# Patient Record
Sex: Male | Born: 1945 | Race: White | Marital: Married | Smoking: Never smoker
Health system: Northeastern US, Academic
[De-identification: ages and names within clinical notes are randomized; demographics above are authoritative.]

## PROBLEM LIST (undated history)

## (undated) ENCOUNTER — Ambulatory Visit: Payer: Self-pay | Source: Ambulatory Visit | Admitting: Primary Care

## (undated) DIAGNOSIS — K219 Gastro-esophageal reflux disease without esophagitis: Secondary | ICD-10-CM

## (undated) DIAGNOSIS — B019 Varicella without complication: Secondary | ICD-10-CM

## (undated) DIAGNOSIS — M4310 Spondylolisthesis, site unspecified: Secondary | ICD-10-CM

## (undated) DIAGNOSIS — I10 Essential (primary) hypertension: Secondary | ICD-10-CM

## (undated) DIAGNOSIS — E079 Disorder of thyroid, unspecified: Secondary | ICD-10-CM

## (undated) HISTORY — DX: Gastro-esophageal reflux disease without esophagitis: K21.9

## (undated) HISTORY — DX: Spondylolisthesis, site unspecified: M43.10

## (undated) HISTORY — DX: Varicella without complication: B01.9

## (undated) HISTORY — DX: Disorder of thyroid, unspecified: E07.9

## (undated) HISTORY — PX: APPENDECTOMY: SHX54

## (undated) HISTORY — DX: Essential (primary) hypertension: I10

---

## 2006-01-12 DIAGNOSIS — E039 Hypothyroidism, unspecified: Secondary | ICD-10-CM | POA: Insufficient documentation

## 2006-02-19 DIAGNOSIS — F411 Generalized anxiety disorder: Secondary | ICD-10-CM | POA: Insufficient documentation

## 2006-02-19 DIAGNOSIS — E0500 Thyrotoxicosis with diffuse goiter without thyrotoxic crisis or storm: Secondary | ICD-10-CM | POA: Insufficient documentation

## 2006-02-19 DIAGNOSIS — K219 Gastro-esophageal reflux disease without esophagitis: Secondary | ICD-10-CM | POA: Insufficient documentation

## 2008-04-14 DIAGNOSIS — I10 Essential (primary) hypertension: Secondary | ICD-10-CM | POA: Insufficient documentation

## 2008-04-18 ENCOUNTER — Encounter: Payer: Self-pay | Admitting: Cardiology

## 2008-04-24 DIAGNOSIS — I4949 Other premature depolarization: Secondary | ICD-10-CM | POA: Insufficient documentation

## 2009-02-13 ENCOUNTER — Ambulatory Visit
Admit: 2009-02-13 | Discharge: 2009-02-13 | Disposition: A | Discharge status: Home / Self Care | Payer: Self-pay | Source: Ambulatory Visit | Attending: Primary Care | Admitting: Primary Care

## 2009-02-16 ENCOUNTER — Ambulatory Visit: Payer: Self-pay | Admitting: Primary Care

## 2009-02-20 ENCOUNTER — Ambulatory Visit: Payer: Self-pay

## 2009-03-07 ENCOUNTER — Ambulatory Visit: Payer: Self-pay

## 2009-03-08 ENCOUNTER — Ambulatory Visit: Payer: Self-pay

## 2009-03-15 ENCOUNTER — Ambulatory Visit: Payer: Self-pay

## 2009-03-16 ENCOUNTER — Other Ambulatory Visit: Payer: Self-pay | Admitting: Primary Care

## 2009-03-16 ENCOUNTER — Ambulatory Visit
Admit: 2009-03-16 | Discharge: 2009-03-16 | Disposition: A | Discharge status: Home / Self Care | Payer: Self-pay | Source: Ambulatory Visit | Attending: Primary Care | Admitting: Primary Care

## 2009-03-16 ENCOUNTER — Ambulatory Visit: Payer: Self-pay | Admitting: Primary Care

## 2009-03-23 ENCOUNTER — Other Ambulatory Visit: Payer: Self-pay | Admitting: Gastroenterology

## 2009-03-23 ENCOUNTER — Ambulatory Visit: Payer: Self-pay | Admitting: Vascular Surgery

## 2009-03-23 ENCOUNTER — Ambulatory Visit: Payer: Self-pay

## 2009-03-29 ENCOUNTER — Ambulatory Visit: Payer: Self-pay | Admitting: Vascular Surgery

## 2009-07-06 ENCOUNTER — Ambulatory Visit
Admit: 2009-07-06 | Discharge: 2009-07-06 | Disposition: A | Discharge status: Home / Self Care | Payer: Self-pay | Source: Ambulatory Visit

## 2009-07-06 LAB — PSA (EFF.4-2010): PSA (eff. 4-2010): 2.48 ng/mL (ref 0.00–4.00)

## 2009-07-12 ENCOUNTER — Ambulatory Visit: Payer: Self-pay

## 2009-07-23 ENCOUNTER — Ambulatory Visit
Admit: 2009-07-23 | Discharge: 2009-07-23 | Disposition: A | Discharge status: Home / Self Care | Payer: Self-pay | Source: Ambulatory Visit | Attending: Primary Care | Admitting: Primary Care

## 2009-07-23 ENCOUNTER — Other Ambulatory Visit: Payer: Self-pay | Admitting: Primary Care

## 2009-07-23 ENCOUNTER — Ambulatory Visit: Payer: Self-pay | Admitting: Primary Care

## 2009-07-23 NOTE — Progress Notes (Signed)
Reason For Visit   Follow up visit.  DNupp LPN.  HPI   Past medical history and medication list is reviewed.     Hypertension-no issues with his blood pressure, specifically he denies any   issues with dizziness, headaches, or any focal neurologic deficits.  From a   cardiac perspective, he denies any chest discomfort or palpitations.     Hypothyroidism-he continues with thyroid replacement without any issues of   fatigue, heat or cold intolerance, changes in his skin, or other concerns.       Recent superficial phlebitis-Several months ago, he developed pain and   swelling in his right lower extremity, and Doppler studies revealed   superficial clot in his lesser saphenous.  He continues to have pain over   the area, although it has improved, it is still present whenever he   performs activities ice skating.  Pain is dull, there is no shortness of   breath or radiation into the groin.     Nonsmoker.  Allergies   Latex-asked/denied  No Known Drug Allergy.  Current Meds   ** Medication reconciliation completed. **.  Enalapril Maleate 10 MG Tablet;TAKE 1 TABLET EVERY DAY; Rx  Levoxyl 100 MCG Tablet;TAKE 1 TABLET EVERY DAY; Rx  Aspirin 81 MG Tablet;TAKE 1 TABLET DAILY.; RPT  Glucosamine CAPS;TAKE AS DIRECTED.; RPT  Multi Vitamin Mens Tablet;TAKE 1 TABLET DAILY.; RPT  Hydrochlorothiazide 25 MG Tablet;TAKE 1 TABLET EVERY DAY; Rx.  Active Problems   Anemia (285.9)  Esophageal Reflux (530.81)  Essential Hypertension (401.9)  Generalized Anxiety Disorder (300.02)  Graves' Disease (242.00)  Hypothyroidism (244.9)  Premature Ventricular Contractions (427.69).  ROS    CONSTITUTIONAL: Appetite good, no fevers, night sweats or weight loss  CV: No chest pain, shortness of breath or peripheral edema  RESPIRATORY: No cough, wheezing or dyspnea  GI: No nausea/vomiting, abdominal pain, or change in bowel habits  GU: No dysuria, urgency or incontinence  NEURO: No MS changes, no motor weakness, no sensory changes.  Vital Signs      Recorded by NUPP,DEBRA on 23 Jul 2009 07:49 AM  BP:110/64,   HR: 56 b/min,   Temp: 96.4 F,   Height: 66 in, Weight: 195 lb, BMI: 31.5 kg/m2.  Physical Exam   GENERAL APPEARANCE: Appears stated age, well appearing, NAD  HEENT: PERRL, EOMI, TMs normal, oropharynx clear  LUNGS: Clear to auscultation and percussion  HEART: Normal S1,S2 without murmurs, gallops, or rub  ABDOMEN: NABS, soft, non-tender, without hepato-splenomegaly  EXTREMITIES: Without clubbing, cyanosis, or edema; slightly tender over the   lateral aspect of the right lower extremity, there is no palpable cord.    There is no redness or erythema, pedal pulses are normal and symmetrical  NEUROLOGIC: Alert and oriented x3, normal sensory function of the lower   extremities.  Assessment   Hypertension - his blood pressure today is outstanding, we will continue   current medical management.  Check kidney function and electrolytes.  Check   lipids as well.     Hypothyroidism-clinically it sounds like he is doing very well.  Continue   current replacement.  Check TSH.     Superficial phlebitis-still having some pain and swelling in the right   lower extremity, I would like to repeat the Doppler study to make sure   there is no propagation of the clot, deep vascular involvement, et Karie Soda.    Otherwise he may just have postphlebitic syndrome and may have to tolerate   some degree of pain  following the superficial phlebitis.  I encouraged it   he continue aspirin 325 mg daily.     Return in 6 months have full physical.  Signature   Electronically signed by: Earl Many  MD Attend.; 07/23/2009 8:06   AM EST.

## 2009-07-26 ENCOUNTER — Ambulatory Visit
Admit: 2009-07-26 | Discharge: 2009-07-26 | Disposition: A | Discharge status: Home / Self Care | Payer: Self-pay | Source: Ambulatory Visit | Attending: Primary Care | Admitting: Primary Care

## 2009-07-26 ENCOUNTER — Ambulatory Visit: Payer: Self-pay | Admitting: Primary Care

## 2009-07-26 LAB — COMPREHENSIVE METABOLIC PANEL
ALT: 17 U/L (ref 0–50)
AST: 23 U/L (ref 0–50)
Albumin: 4.3 g/dL (ref 3.5–5.2)
Alk Phos: 58 U/L (ref 40–130)
Anion Gap: 9 (ref 7–16)
Bilirubin,Total: 0.5 mg/dL (ref 0.0–1.2)
CO2: 28 mmol/L (ref 20–28)
Calcium: 9 mg/dL (ref 8.6–10.2)
Chloride: 101 mmol/L (ref 96–108)
Creatinine: 1.33 mg/dL — ABNORMAL HIGH (ref 0.67–1.17)
GFR,Black: >59 *
GFR,Caucasian: 54 * — AB
Glucose: 95 mg/dL (ref 74–106)
Lab: 20 mg/dL (ref 6–20)
Potassium: 4.2 mmol/L (ref 3.3–5.1)
Sodium: 138 mmol/L (ref 133–145)
Total Protein: 6.5 g/dL (ref 6.3–7.7)

## 2009-07-26 LAB — LIPID PANEL
Chol/HDL Ratio: 2.7
Cholesterol: 181 mg/dL
HDL: 66 mg/dL
LDL Calculated: 106 mg/dL
Non HDL Cholesterol: 115 mg/dL
Triglycerides: 43 mg/dL

## 2009-07-26 LAB — TSH: TSH: 1.95 u[IU]/mL (ref 0.27–4.20)

## 2009-11-23 ENCOUNTER — Ambulatory Visit: Payer: Self-pay | Admitting: Primary Care

## 2009-11-23 LAB — TIBC
Iron: 84 ug/dL (ref 45–170)
TIBC: 326 ug/dL (ref 250–450)
Transferrin Saturation: 26 % (ref 20–55)
UIBC: 242 ug/dL (ref 110–370)

## 2009-11-23 LAB — SEDIMENTATION RATE, AUTOMATED: Sedimentation Rate: 10 mm/h (ref 0–20)

## 2009-11-23 LAB — CK: CK: 63 U/L (ref 46–171)

## 2009-11-23 LAB — TSH: TSH: 1.71 u[IU]/mL (ref 0.27–4.20)

## 2009-11-23 LAB — VITAMIN B12: Vitamin B12: 859 pg/mL (ref 211–946)

## 2009-11-23 NOTE — Progress Notes (Signed)
Reason For Visit   Follow up visit.  DNupp LPN.  HPI   Past medical history and medication list was reviewed.     Patient is here today with several complaints, the first is migrating   myalgias, which have been involving his upper forearms, but mainly his   lower legs.  He also describes a second complaint of burning in his feet,   and occasional numbness.  Both of these symptoms seem to be associated with   lower back pain and stiffness.  He quantifies the pain is no more than 4/10   in severity, and qualifies it as more of a nuisance than anything.    However, as he recently started having back pain and stiffness over the   past 6 months, he is concerned that he may have a herniated disc.  At times   he will have weakness especially in his left leg, and it feels as if the   buckles and gives  He has not had any weight loss or significant abdominal   pain, he denies any issues with chest pain or pressure or palpitations.    Although he has myalgias affecting the muscles, he does not have   significant swelling of the joints of the wrists or knees or hips.     He does not drink alcohol excessively.  Allergies   Latex-asked/denied  No Known Drug Allergy.  Current Meds   ** Medication reconciliation completed. **.  Aspirin 81 MG Tablet;TAKE 1 TABLET DAILY.; RPT  Levoxyl 100 MCG Tablet;TAKE 1 TABLET EVERY DAY; Rx  Hydrochlorothiazide 25 MG Tablet;TAKE 1 TABLET EVERY DAY; Rx  Enalapril Maleate 10 MG Tablet;TAKE 1 TABLET EVERY DAY; Rx  Glucosamine CAPS;TAKE AS DIRECTED.; RPT  Multi Vitamin Mens Tablet;TAKE 1 TABLET DAILY.; RPT.  Active Problems   Anemia (285.9)  Esophageal Reflux (530.81)  Essential Hypertension (401.9)  Generalized Anxiety Disorder (300.02)  Graves' Disease (242.00)  Hypothyroidism (244.9)  Premature Ventricular Contractions (427.69).  ROS   CONSTITUTIONAL: Appetite good, no fevers, night sweats or weight loss  CV: No chest pain, shortness of breath or peripheral edema  RESPIRATORY: No cough, wheezing  or dyspnea  GI: No nausea/vomiting, abdominal pain, or change in bowel habits  GU: No dysuria, urgency or incontinence  NEURO: No history of seizures or significant migraine headaches.  Vital Signs   Recorded by NUPP,DEBRA on 23 Nov 2009 08:52 AM  BP:120/78,   HR: 56 b/min,   Temp: 97.4 F,   Height: 66 in, Weight: 196 lb, BMI: 31.6 kg/m2.  Physical Exam   GENERAL APPEARANCE: Appears stated age, well appearing, NAD  neck: supple, he has normal range of motion, there is certainly no bony   tenderness or nuchal rigidity  LUNGS: Clear to auscultation normal respiratory effort  HEART: Normal S1,S2 without murmurs, gallops, or rub  ABDOMEN: NABS, soft, non-tender, without hepato-splenomegaly  EXTREMITIES: Without clubbing, cyanosis, or edema  back: he is restricted in his forward flexion, slightly restricted with   side bending to the left, straight leg raise is intact bilaterally, there   is no bony tenderness directly over the midline or spinous processes.  NEUROLOGIC: Alert and oriented x3, cranial nerves II-XII intact,   motor/sensory exam normal, DTRs symmetric, normal gait.  Assessment   Back pain with potential radiculopathy in the legs     Myalgias     Plan - at this point in time it's difficult to tease out exactly what is   going on here, and  the differential diagnosis is including but not limited   to possible spinal stenosis, herniated lumbar disc, B12 deficiency, over or   under replaced thyroid, etc.     I offered him analgesics for pain relief but he declined at this time.       I would like to increase his workup including a B12 level, repeat TSH,   CPK level and a sedimentation rate.     Furthermore, we will obtain an MRI of his lumbosacral spine now that he has   the back pain with stiffness and of course the radicular symptomatology.  Signature   Electronically signed by: Earl Many  MD Attend.; 11/23/2009 9:24   AM EST.

## 2009-12-03 ENCOUNTER — Encounter: Payer: Self-pay | Admitting: Gastroenterology

## 2009-12-04 ENCOUNTER — Ambulatory Visit: Payer: Self-pay | Admitting: Primary Care

## 2009-12-04 NOTE — Progress Notes (Signed)
Reason For Visit   Right leg swollen briused redden. J Mcintyre  lpn.  HPI   Patient has a red raised and swollen area on his right lower leg after   being hit by a hockey pock several days ago.  It is painful to the touch,   pain is no more than 3/10 in severity, his biggest concern was the swelling   and redness, and because of his previous history of superficial phlebitis   he wanted to be evaluated.  He started elevating the area and it seems to   have improved, he does not have any shortness of breath nor does he have   any issues with numbness in the foot.  Allergies   Latex-asked/denied  No Known Drug Allergy.  Current Meds   Aspirin 81 MG Tablet;TAKE 1 TABLET DAILY.; RPT  Hydrochlorothiazide 25 MG Tablet;TAKE 1 TABLET EVERY DAY; Rx  Enalapril Maleate 10 MG Tablet;TAKE 1 TABLET EVERY DAY; Rx  Glucosamine CAPS;TAKE AS DIRECTED.; RPT  Multi Vitamin Mens Tablet;TAKE 1 TABLET DAILY.; RPT  Levoxyl 100 MCG Tablet;TAKE 1 TABLET EVERY DAY; Rx.  Active Problems   Anemia (285.9)  Esophageal Reflux (530.81)  Essential Hypertension (401.9)  Generalized Anxiety Disorder (300.02)  Graves' Disease (242.00)  Hypothyroidism (244.9)  Premature Ventricular Contractions (427.69).  Vital Signs   Recorded by [facility] on 04 Dec 2009 03:52 PM  BP:110/72,   HR: 58 b/min,   Weight: 196.8 lb,   Pain Scale: 0.  Physical Exam   GENERAL APPEARANCE: anxious appearing gentleman nobody talks in full   sentences with a normal respiratory rate  EXTREMITIES: he has excellent motor strength and sensory exam of the lower   extremity, certainly no foot drop, normal pedal pulses.  Lung inner aspect   of the right lower anterior tibial area he has a slightly raised hematoma   measuring roughly 2 cm.  It is tender to the touch, there is no bony   tenderness over the tibial area.  He has slight bruising which is dependent   near the foot.  Assessment   Hematoma to the leg - at this point in time I don't think he has a   fracture, consequently I  don't see any need for imaging.  I recommended   elevation of the leg, ice every night, and gentle use of ibuprofen 400 mg   every 8 hours.  He has been recommended to followup if symptoms worsen, he   developed significant shortness of breath, or numbness.  Signature   Electronically signed by: Earl Many  MD Attend.; 12/04/2009 4:32   PM EST.

## 2009-12-12 ENCOUNTER — Ambulatory Visit
Admit: 2009-12-12 | Discharge: 2009-12-12 | Disposition: A | Discharge status: Home / Self Care | Payer: Self-pay | Source: Ambulatory Visit | Attending: Primary Care | Admitting: Primary Care

## 2009-12-12 DIAGNOSIS — M79609 Pain in unspecified limb: Secondary | ICD-10-CM

## 2010-01-07 ENCOUNTER — Ambulatory Visit: Payer: Self-pay | Admitting: Primary Care

## 2010-01-07 NOTE — H&P (Signed)
 Reason For Visit   Physical. J Mcintyre lpn.  HPI   Past medical history and medication list was reviewed.     From a cardiovascular perspective, he has been doing quite well from a para   2 his blood pressure and history of PVCs.  In fact, he has not had any   problems with premature ventricular contractions or any palpitations over   the past year or so.  He plays hockey avidly, no exertional problems, no   issues with dizziness when he stands.     From an endocrinology perspective, feels generally well on his current   program of thyroid replacement.  Specifically no issues with heat or cold   tolerability.     .  Allergies   Latex-asked/denied  No Known Drug Allergy.  Current Meds   ** Medication reconciliation completed. **.  Aspirin 81 MG Tablet;TAKE 1 TABLET DAILY.; RPT  Hydrochlorothiazide 25 MG Tablet;TAKE 1 TABLET EVERY DAY; Rx  Enalapril Maleate 10 MG Tablet;TAKE 1 TABLET EVERY DAY; Rx  Glucosamine CAPS;TAKE AS DIRECTED.; RPT  Multi Vitamin Mens Tablet;TAKE 1 TABLET DAILY.; RPT  Levoxyl 100 MCG Tablet;TAKE 1 TABLET EVERY DAY; Rx.  Active Problems   Esophageal Reflux (530.81)  Essential Hypertension (401.9)  Generalized Anxiety Disorder (300.02)  Graves' Disease (242.00)  Hypothyroidism (244.9)  Premature Ventricular Contractions (427.69).  Family Hx   Positive for hypertension.  Personal Hx   Married, nonsmoker, does not drink alcohol heavily.  ROS   CONSTITUTIONAL: Appetite good, no fevers, night sweats or weight loss  EYES: No visual changes, no eye pain  ENT: No hearing difficulties, no ear pain  CV: No chest pain, shortness of breath or peripheral edema  RESPIRATORY: No cough, wheezing or dyspnea  GI: No nausea/vomiting, abdominal pain, or change in bowel habits  GU: No dysuria, urgency or incontinence  MS: No joint pain/swelling or musculoskeletal deformities  SKIN: He recently had several small bruises on his legs after being struck   by a hockey pock  NEURO: No MS changes, no motor weakness, no  sensory changes  PSYCH: No depression or anxiety  ENDOCRINE: No polyuria/polydipsia, no heat intolerance  HEME/LYMPH: No easy bleeding/bruising or swollen nodes  ALL/IMMUN: No allergic reactions.  Immunizations   Td; 24 Oct 2003  Influenza; 22 Feb 2008  Influenza; 09 Jan 2009.  Health Mgmt Plan   Colonoscopy every 10 years; for HEALTH MAINTENANCE.  Vital Signs   Recorded by [facility] on 07 Jan 2010 02:27 PM  BP:120/90,   HR: 52 b/min,   Height: 65 in, Weight: 192 lb, BMI: 32 kg/m2,   Pain Scale: 0.  Physical Exam   GENERAL APPEARANCE: Normal habitus. Well developed, well groomed. Appears   stated age. No acute distress. Color good.  MENTAL STATUS: Appears alert and oriented. slightly anxious affect  SKIN: Skin color and turgor normal. No suspicious lesions, masses, rashes,   or ulcerations. Nails and hair appear normal.  he does have a few bruises   on his inner thigh and right ankle  HEAD: Normocephalic.  EARS: External ear w/o scars, masses, or lesions. External auditory canal   intact, clear, and w/o lesions. TMs intact with normal light reflex and   landmarks. Acuity to conversational tones good.   EYES: PERRLA, EOMI. Lids w/o defect, conjunctiva and sclera appear normal.   NOSE: Nasal mucosa and turbinates pink, septum midline, no lesions.  MOUTH: Teeth in good repair. Gums pink w/o lesions. Normal appearing   mucosa, palate, and  tongue.   OROPHARYNX: Moist w/o exudate, erythema, or swelling.   NECK: Symmetric, trachea midline. Full ROM w/o pain or tenderness. Thyroid   nontender w/o enlargement or masses. Carotid pulses normal with no bruits.   No cervical lymphadenopathy.   CHEST: Respirations unlabored with normal diaphragmatic excursion. Chest   wall symmetric with no masses. Breath sounds clear bilaterally w/o wheezes,   rubs, rales, or rhonchi.  CV: bradycardic but completely regular, no significant ectopy, no murmur  ABDOMEN: Abdomen soft with normal bowel sounds. No guarding or rebound. No    palpable masses or tenderness. Liver and spleen are w/o tenderness or   enlargement. No aortic widening. No inguinal adenopathy.   GU: Normal penis, testes, and cords bilaterally, without masses or   tenderness. No penile discharge or ulcerations. Prostate 1+ in size but   soft and smooth, completely symmetrical  RECTAL: Perineum and anus w/o lesions, masses, or hemorrhoids. Normal   sphincter tone. Heme negative.  MS: Muscle tone and strength normal for age, w/o atrophy or abnormal   movement.   EXTREMITIES: Joints w/full ROM, w/o tenderness, crepitus, or contracture.   No obvious joint deformities or effusions.   NEUROLOGICAL: Cranial nerves II-XII intact. Motor strength symmetrical with   no obvious weaknesses. Superficial sensation intact bilaterally to light   touch and pain. Observed dexterity w/o ataxia or tremor. Deep tendon   reflexes full and symmetric bilaterally. Gait coordinated and smooth.  Assessment   Routine health maintenance - we reviewed the importance of having advanced   directives and of course a healthcare proxy.  Colonoscopy is up to date, I   encouraged a high fiber intake.  From a urology standpoint, he recently was   evaluated by his urologist who felt that his prostate felt clinically   benign and did not require biopsy.  The previous asymmetry that I felt last   year seems to have resolved, I specifically don't feel any nodule.  Have   recommended that we check a PSA.  Otherwise, he received a flu shot today,   at age 18 pneumonia shot would be appropriate.     HYPERTENSION  --According to JNC 7 guidelines target BP: less than 140/90 patient   currently is at goal; discussed goal with patient  Plan to reach goal includes:  --Lifestyle Modifications; discussed DASH eating plan; discussed dietary   sodium reduction; discussed aerobic physical activity ; discussed   moderation of alcohol consumption       --Following our conversation the patient is willing to make necessary   changes YES        --Self-management tool provided  NO  --Medication Management: no changes made  --Referral for Care Management:  no  --Follow up in 6 months     His EKG was reviewed, he continues to show a rather bradycardic rhythm, but   there is no PVCs.  Furthermore, he is completely asymptomatic     .  Coun/Edu    --Health care proxy discussed   --Diet/body weight discussed  --Aerobic exercise discussed  --Alcohol use discussed  --Colon CA screening discussed.  --Testicular self exam discussed  --Prostate cancer screening discussed      --Skin CA awareness/prevention discussed  --Dental care discussed  --Cardiac risk factor modification discussed  --Motor vehicle safety discussed.  Signature   Electronically signed by: Earl Many  MD Attend.; 01/07/2010 2:58   PM EST.

## 2010-04-16 NOTE — Miscellaneous (Unsigned)
 Continuity of Care Record  Created: todo  From: MOMOT, CHRISTOPHER  From:   From: [employer], EHR v10.2.7.53  To: Gwenyth Allegra  Purpose: Patient Use;       Problems  Diagnosis: Esophageal Reflux (530.81)   Diagnosis: Essential Hypertension (401.9)   Diagnosis: Generalized Anxiety Disorder (300.02)   Diagnosis: Graves' Disease (242.00)   Diagnosis: Hypothyroidism (244.9)   Diagnosis: Obstructive Sleep Apnea (327.23)   Diagnosis: Premature Ventricular Contractions (427.69)     Alerts  Allergy - Latex-asked/denied   Allergy - No Known Drug Allergy     Medications  Aspirin 81 MG Tablet; TAKE 1 TABLET DAILY. ; RPT   Enalapril Maleate 10 MG Tablet; TAKE 1 TABLET EVERY DAY ; Rx   Hydrochlorothiazide 25 MG Tablet; TAKE 1 TABLET EVERY DAY ; Rx   Levoxyl 100 MCG Tablet; TAKE 1 TABLET EVERY DAY ; Rx   Non-medication order(s); CPAP and head gear#one ; Rx     Immunizations  Td   Influenza   Influenza   Influenza

## 2010-04-16 NOTE — Letter (Signed)
March 23, 2009    Lu Duffel, MD  [address]  [address]  [place], [place]  [address]      RE:   Garrett Rodriguez  DOB:  [birth year]  Unit#: [id number]    Dear Dr. Durwin Reges:    It was a pleasure seeing your patient, Garrett Rodriguez, in my office today.  As you know, Mr. Lapine is a 65 year old male, who presents with a chief  complaint of right calf pain with walking, as well as a blood clot in his  superficial vein in his lower extremities.  Simran reports that as of the  past 6 months, what he has noted is increasing pain, which begins in his  right calf and involves his posterior right thigh to the level of the  buttock after walking certain distances.  Patient initially had not noticed  his pain.  However, now it bothers him to the point that he must rest, and  after a fair amount of rest, this pain seems to get better for him.  The  patient denies any traumatic incidents or events that he can think around  which this pain began.  The patient denies any rest pain or tissue loss  type symptoms in his lower extremities.  The patient also denies any  postprandial abdominal pain or TIA like symptoms.    Past medical history:  Significant for hypertension.    Allergies:  None.    Current medications:  Levoxyl, enalapril, hydrochlorothiazide,  multivitamin, and glucosamine, as well as an aspirin.    Social history:  Patient denies any tobacco use and drinks approximately  one alcoholic drink per day.  He is currently employed as an Airline pilot.    Family history:  Patient denies any family history of arterial or venous  related disorders.    Review of systems:  A complete 14 point review of systems was performed and  available in the office chart for review.  However, significant for some  heart palpitations, as well as some arthritic symptoms in his joints.  The  patient also reports the above pain symptoms in his calf with activity.  Of  note, patient denies any chest pain or shortness of breath upon  exertion.  Patient also denies any neurologic or psychiatric disorders.    Physical examination:  General:  An elderly male in no acute distress.  Head:  Normocephalic, atraumatic.  Pupils equally round.  Neck:  Supple,  trachea midline.  Heart:  Regular.  Abdomen:  Soft, nontender.  Peripheral  pulse exam:  2+ bilateral radial and dorsalis pedis pulses.  Right lower  extremity:  No cyanosis, clubbing, or edema.  No open lesions or sores are  noted.  No hyperpigmentation or skin changes are noted.  Left lower  extremity:  No cyanosis, clubbing, or edema.  No hyperpigmentation or skin  changes.  No open lesions or sores are noted.    Imaging studies:  Ankle brachial index at rest performed in the vascular  lab reveals an ABI of 1.17 on the right and 1.13 on the left.  Due to the  patient's symptoms, an exercise ABI was performed, which involves ABI  measurements after exercise stimulus, which revealed no change in the ABIs,  1.18 on the right and 1.19 on the left.    Assessment:  A 65 year old male with complaints of right calf pain, which  are not associated with any evidence of arterial occlusive disease or lack  of arterial perfusion.  Plan:  I had a long discussion with Mr. Livingood regarding the fact that his  arterial perfusion is indeed intact to his bilateral lower extremities, and  this does not appear to be true claudication as a result of arterial  insufficiency.  I do think that with the symptoms involving his calf, as  well as his posterior leg radiating up to his buttock, it would be  important to proceed with further workup for neurogenic causes of this pain  including low back problems and compression of his sciatic nerve as you see  fit.  I also instructed him that this superficial thrombophlebitis does not  require any treatment with anticoagulant of sort, but can be treated  symptomatically with warm and cold compresses, as well as NSAID use for  improvement in symptoms.  He should probably notice  the decrease in the  cord inflammation over the ensuing several weeks.    Thank you very much for the opportunity to provide him care, and we will  not schedule him for a formal followup visit with [place] here in the vascular  office.  However, if you have any further concerns or questions, please do  not hesitate to call.      Sincerely,                Electronically Signed and Finalized by  Majel Homer, MD 03/26/2009 09:45  ____________________________________  Majel Homer, MD      DD:   03/23/2009  DT:   03/23/2009  1:37 P  ZO/[id number]  [id number]      cc:   Lu Duffel, MD

## 2010-04-18 ENCOUNTER — Ambulatory Visit
Admit: 2010-04-18 | Discharge: 2010-04-18 | Disposition: A | Discharge status: Home / Self Care | Payer: Self-pay | Source: Ambulatory Visit | Attending: Urology | Admitting: Urology

## 2010-04-18 LAB — PSA (EFF.4-2010): PSA (eff. 4-2010): 2.32 ng/mL (ref 0.00–4.00)

## 2010-04-24 ENCOUNTER — Other Ambulatory Visit: Payer: Self-pay | Admitting: Urology

## 2010-05-22 ENCOUNTER — Ambulatory Visit: Payer: Self-pay | Admitting: Urology

## 2010-05-23 NOTE — Letter (Signed)
 May 22, 2010    Lu Duffel, MD  [address]  [address]  [place], [place]  [address]      RE:   Garrett Rodriguez, Garrett Rodriguez  DOB:  1945-07-31  Unit#: [id number]    Dear Dr. Durwin Reges:    This is a followup on Garrett Rodriguez who has history of mild nodular prostate  with normal PSA and mild Peyronie disease.  He is doing well.  He has no  significant urinary issues.  He wakes up 0 to 1 time at night.  He has mild  hesitancy at times, but it does not bother him.  He denies any dysuria.  His PSA came back lower at 2.32.  Previously, it was 2.48.  He has some  mild Peyronie's with an upward bend that does not appear to affect his  erections or ability for penetration.    On physical exam, he is well appearing in no acute distress.  No CVA  tenderness.  No suprapubic fullness.  No peripheral edema.  He does have  some hemorrhoids.  He has some mild asymmetry with no distinct nodule  noted.    His PSA is stable, and his digital rectal exam continues to be slightly  abnormal but not worrisome enough for prostate biopsy.  We will continue to  follow it.  If there is any change, he understands he will need a biopsy.  In regards to his Peyronie disease, we discussed observing this.  I  discussed some types of p.o. treatment but told him that they did not work  very well.  At this juncture, we will follow him.  He will call me with any  questions or concerns.    ASSESSMENT:     1. 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     2. Mild Peyronie's unchanged.    PLAN:     1. PSA/DRE in 1 year.     2. Observation of Peyronie's.          Sincerely,              Electronically Signed and Finalized by  Zerita Boers, MD 05/24/2010 09:00  ____________________________________  Zerita Boers, MD      DD:   05/22/2010  DT:   05/23/2010  8:51 P  DVI:  [id number]  [id number]    cc:   Lu Duffel, MD

## 2010-06-15 DIAGNOSIS — G4733 Obstructive sleep apnea (adult) (pediatric): Secondary | ICD-10-CM | POA: Insufficient documentation

## 2010-07-22 ENCOUNTER — Encounter: Payer: Self-pay | Admitting: Primary Care

## 2010-07-22 ENCOUNTER — Ambulatory Visit: Payer: Self-pay | Admitting: Primary Care

## 2010-07-22 LAB — COMPREHENSIVE METABOLIC PANEL
ALT: 15 U/L (ref 0–50)
AST: 24 U/L (ref 0–50)
Albumin: 4.4 g/dL (ref 3.5–5.2)
Alk Phos: 72 U/L (ref 40–130)
Anion Gap: 8 (ref 7–16)
Bilirubin,Total: 0.3 mg/dL (ref 0.0–1.2)
CO2: 29 mmol/L — ABNORMAL HIGH (ref 20–28)
Calcium: 9 mg/dL (ref 8.6–10.2)
Chloride: 103 mmol/L (ref 96–108)
Creatinine: 1.18 mg/dL — ABNORMAL HIGH (ref 0.67–1.17)
GFR,Black: >59 *
GFR,Caucasian: >59 *
Glucose: 100 mg/dL (ref 74–106)
Lab: 21 mg/dL — ABNORMAL HIGH (ref 6–20)
Potassium: 4.2 mmol/L (ref 3.3–5.1)
Sodium: 140 mmol/L (ref 133–145)
Total Protein: 6.6 g/dL (ref 6.3–7.7)

## 2010-07-22 LAB — LIPID PANEL
Chol/HDL Ratio: 2.7
Cholesterol: 185 mg/dL
HDL: 69 mg/dL
LDL Calculated: 105 mg/dL
Non HDL Cholesterol: 116 mg/dL
Triglycerides: 53 mg/dL

## 2010-07-22 LAB — TSH: TSH: 1.32 u[IU]/mL (ref 0.27–4.20)

## 2010-07-22 NOTE — Progress Notes (Signed)
 Reason For Visit   Follow up for HTN, thyroid and he also has mild pain on his left elbow.  HPI   Past medical history her medication list was reviewed.     Hypothyroidism-seems to be doing well with his thyroid replacement, no   issues with excessive fatigue, no problems with skin changes, hair loss, et   Karie Soda.     Hypertension-feels fantastic with hydrochlorothiazide and his ACE   inhibitor.  Historically he has had a low resting heart rate, but he is   completely asymptomatic and has not had problems with dizziness, shortness   of breath or chest pain.  He also has a history of PVCs, but they have been   intermittent, and he has not had any problems in the past few months.     Left elbow pain-he plays hockey, has slight pain 2/10 in severity in the   left lateral elbow.  It is worse when he uses it repetitiously, but no   numbness in the hand, no loss of function.  No problems with loss of grip   strength.     Nonsmoker.  Allergies   Latex-asked/denied  No Known Drug Allergy.  Current Meds   Aspirin 81 MG Tablet;TAKE 1 TABLET DAILY.; RPT  Non-medication order(s);CPAP and head gear#one; Rx  Levoxyl 100 MCG Tablet;TAKE 1 TABLET EVERY DAY; Rx  Enalapril Maleate 10 MG Tablet;TAKE 1 TABLET EVERY DAY; Rx  Hydrochlorothiazide 25 MG Tablet;TAKE 1 TABLET EVERY DAY; Rx.  Active Problems   Esophageal Reflux (530.81)  Essential Hypertension (401.9)  Generalized Anxiety Disorder (300.02)  Graves' Disease (242.00)  Hypothyroidism (244.9)  Obstructive Sleep Apnea (327.23)  Premature Ventricular Contractions (427.69).  ROS   CONSTITUTIONAL: Appetite good, no fevers, night sweats or recurrent   infections  CV: No chest pain, shortness of breath or peripheral edema  RESPIRATORY: No cough, wheezing or dyspnea  GI: No nausea/vomiting, abdominal pain, or issues with constipation  GU: No dysuria, urgency or incontinence; recently saw his urologist, who   felt that everything was stable regarding his prostate  NEURO: No MS changes,  no motor weakness, no sensory changes.  Vital Signs   Recorded by Abishai Viegas on 22 Jul 2010 08:07 AM  Pain Scale: 2  Recorded by Harlon Flor on 22 Jul 2010 07:56 AM  BP:118/78,   HR: 60 b/min,   Height: 65.25 in, Weight: 196 lb, BMI: 32.4 kg/m2.  Physical Exam   GENERAL: Appears well. NAD. Color good.  HEAD: Without bony or soft tissue defects.  EYES: Earll icterus  EARS: Canals clear, tympanic membranes WNL. Gross acuity to spoken and   whispered word WNL.  NECK: Trachea midline. Thyroid not enlarged and without masses. No   lymphadenopathy. Full ROM without pain or tenderness.  lungs clear to auscultation with normal respiratory effort  Heart as a bradycardic rate but no murmur or significant ectopy  He is tender in the left lateral epicondyle area but no palpable mass.    Good grip strength, normal radial pulses.  Assessment   HYPERTENSION  --According to JNC 7 guidelines target BP: less than 140/90 patient   currently is at goal; discussed goal with patient  Plan to reach goal includes:  --Lifestyle Modifications    --Medication Management: no changes made  --Referral for Care Management:  no  --Follow up in 6 months     check kidney function and electrolytes along with a lipid profile.     Hypothyroidism - clinically he has been recovery from  Graves' disease,   check TSH and adjust thyroid replacement if necessary     Lateral epicondylitis-recommended Advil 400 mg as needed, counterpressure   band and resting.  Signature   Electronically signed by: Earl Many  MD Attend.; 07/22/2010 8:11   AM EST.

## 2010-10-22 ENCOUNTER — Encounter: Payer: Self-pay | Admitting: Gastroenterology

## 2010-11-30 ENCOUNTER — Other Ambulatory Visit: Payer: Self-pay | Admitting: Primary Care

## 2011-01-20 ENCOUNTER — Encounter: Payer: Self-pay | Admitting: Primary Care

## 2011-01-20 ENCOUNTER — Ambulatory Visit: Payer: Self-pay | Admitting: Primary Care

## 2011-01-20 DIAGNOSIS — Z139 Encounter for screening, unspecified: Secondary | ICD-10-CM

## 2011-01-20 DIAGNOSIS — E7889 Other lipoprotein metabolism disorders: Secondary | ICD-10-CM

## 2011-01-20 DIAGNOSIS — E039 Hypothyroidism, unspecified: Secondary | ICD-10-CM

## 2011-01-20 DIAGNOSIS — Z0000 Encounter for general adult medical examination without abnormal findings: Secondary | ICD-10-CM

## 2011-01-20 NOTE — Progress Notes (Signed)
Reason For Visit     Annual health maintenance assessment    HPI     This is a delightful 65 year old gentleman, from a cardiovascular perspective he has a history of low heart rate, but things feel fantastic from that standpoint.  He also has mild hypertension which is well controlled.  Her cardiac perspective no issues with palpitations.    He also has a history of hypothyroidism in the setting of Graves' disease.  Feels good with good energy level.  No changes in his hair or skin.  No constipation issues.      Acid reflux is well-controlled, no breakthrough symptoms.    His only concern is his sister was recently diagnosed with breast cancer, she was BRCA-1 positive for mutation.    Patient Active Problem List   Diagnoses Code   . Hypothyroidism 244.9   . Graves' Disease 242.00   . Generalized Anxiety Disorder 300.02   . Esophageal Reflux 530.81   . Essential Hypertension 401.9   . Obstructive Sleep Apnea 327.23     Filed Vitals:    01/20/11 1419   BP: 118/80   Pulse: 52   Temp: 36.9 C (98.4 F)   Height: 1.676 m (5\' 6" )   Weight: 89.994 kg (198 lb 6.4 oz)     Current Outpatient Prescriptions   Medication Sig   . LEVOXYL 100 MCG tablet TAKE 1 TABLET EVERY DAY   . aspirin 81 MG tablet TAKE 1 TABLET DAILY.   . hydrochlorothiazide (HYDRODIURIL) 25 MG tablet TAKE 1 TABLET EVERY DAY   . enalapril (VASOTEC) 10 MG tablet TAKE 1 TABLET EVERY DAY         Family Hx - as above    Personal Hx - nonsmoker no alcohol    ROS   CONSTITUTIONAL: Appetite good, no fevers, night sweats or weight loss  EYES: No visual changes, no eye pain  ENT: No hearing difficulties, no ear pain  CV: No chest pain, shortness of breath or peripheral edema  RESPIRATORY: No cough, wheezing or dyspnea  GI: No nausea/vomiting, abdominal pain, or change in bowel habits  GU: No dysuria, urgency or incontinence  MS: No joint pain/swelling or musculoskeletal deformities  SKIN: No rashes  NEURO: No MS changes, no motor weakness, no sensory changes  PSYCH:  No depression or anxiety  ENDOCRINE: No polyuria/polydipsia, no heat intolerance  HEME/LYMPH: No easy bleeding/bruising or swollen nodes  ALL/IMMUN: No allergic reactions.      Physical Exam     GENERAL APPEARANCE: Normal habitus. Well developed, well groomed. Appears stated age. No acute distress. Color good.  MENTAL STATUS: Appears alert and oriented. Affect appropriate.   SKIN: Skin color and turgor normal. No suspicious lesions, masses, rashes, or ulcerations. Nails and hair appear normal.   HEAD: Normocephalic.  EARS: External ear w/o scars, masses, or lesions. External auditory canal intact, clear, and w/o lesions. TMs intact with normal light reflex and landmarks. Acuity to conversational tones good.   EYES: PERRLA, EOMI. Lids w/o defect, conjunctiva and sclera appear normal.   Fundi w/o papilledema, hemorrhage, exudates, or arterial abnormalities.  NOSE: Nasal mucosa and turbinates pink, septum midline, no lesions.  MOUTH: Teeth in good repair. Gums pink w/o lesions. Normal appearing mucosa, palate, and tongue.   OROPHARYNX: Moist w/o exudate, erythema, or swelling.   NECK: Symmetric, trachea midline. Full ROM w/o pain or tenderness. Thyroid nontender w/o enlargement or masses. Carotid pulses normal with no bruits. No cervical lymphadenopathy.   CHEST: Respirations  unlabored with normal diaphragmatic excursion. Chest wall symmetric with no masses. Breath sounds clear bilaterally w/o wheezes, rubs, rales, or rhonchi.   BREASTS: normal male breast exam, no palpable masses  CV: Normal precordium and PMI w/o lifts, heaves, or thrills. Otherwise rate is bradycardic  ABDOMEN: Abdomen soft with normal bowel sounds. No guarding or rebound. No palpable masses or tenderness. Liver and spleen are w/o tenderness or enlargement. No aortic widening. No inguinal adenopathy.   GU: Normal penis, testes, and cords bilaterally, without masses or tenderness. No penile discharge or ulcerations. Prostate no enlargement noted,  symmetric, w/o nodularity, tenderness, or masses. No hernias.  RECTAL: Perineum and anus w/o lesions, masses, or hemorrhoids. Normal sphincter tone. Heme negative.  MS: Muscle tone and strength normal for age, w/o atrophy or abnormal movement.   EXTREMITIES: Joints w/full ROM, w/o tenderness, crepitus, or contracture. No obvious joint deformities or effusions.   NEUROLOGICAL: Cranial nerves II-XII intact. Motor strength symmetrical with no obvious weaknesses. Superficial sensation intact bilaterally to light touch and pain. Observed dexterity w/o ataxia or tremor. Deep tendon reflexes full and symmetric bilaterally. Gait coordinated and smooth.    EKG: Sinus bradycardia    Assessment     Routine health maintenance-the patient is advanced directives and healthcare proxy.  Colonoscopy up to date.  Encourage high fiber.  Prostate exam was reviewed, check PSA.  Up-to-date on vaccinations, given flu shot.  Aspirin 81 mg daily for cardiovascular protection.  Followup 6 months.      We reviewed the recent testing performed on the sister, I would encourage that he be screened because of the risk of prostate cancer, male breast cancer, even thyroid malignancy.He will contact his insurance company to see if it's a covered benefit.  If the test negative I don't see any need for routine mammography but as he was positive I would perform routine male mammograms annually.    Bradycardia-EKG reviewed, no ischemia.  Patient asymptomatic.    Hypothyroidism-check TSH.    Anxiety-clinically stable.  Continue current medical management.    Of course we will also check upper has metabolic profile and a lipid panel.  Coun/Edu    --Health care proxy discussed   --Diet/body weight discussed  --Aerobic exercise discussed  --Alcohol use discussed  --Colon CA screening discussed.  --Testicular self exam discussed  --Prostate cancer screening discussed   --Breast self exam discussed.  --Mammogram screening discussed  --Skin CA  awareness/prevention discussed  --Dental care discussed  --Cardiac risk factor modification discussed.

## 2011-02-06 ENCOUNTER — Other Ambulatory Visit: Payer: Self-pay | Admitting: Primary Care

## 2011-04-04 ENCOUNTER — Telehealth: Payer: Self-pay | Admitting: Primary Care

## 2011-05-30 ENCOUNTER — Other Ambulatory Visit: Payer: Self-pay | Admitting: Primary Care

## 2011-06-02 ENCOUNTER — Other Ambulatory Visit: Payer: Self-pay | Admitting: Urology

## 2011-06-02 ENCOUNTER — Ambulatory Visit
Admit: 2011-06-02 | Discharge: 2011-06-02 | Disposition: A | Discharge status: Home / Self Care | Payer: Self-pay | Source: Ambulatory Visit | Attending: Urology | Admitting: Urology

## 2011-06-02 DIAGNOSIS — R972 Elevated prostate specific antigen [PSA]: Secondary | ICD-10-CM

## 2011-06-02 LAB — PSA (EFF.4-2010): PSA (eff. 4-2010): 2.95 ng/mL (ref 0.00–4.00)

## 2011-06-04 ENCOUNTER — Ambulatory Visit: Payer: Self-pay | Admitting: Urology

## 2011-06-04 ENCOUNTER — Encounter: Payer: Self-pay | Admitting: Urology

## 2011-06-04 VITALS — BP 129/69 | HR 56 | Ht 66.0 in | Wt 188.0 lb

## 2011-06-04 DIAGNOSIS — N486 Induration penis plastica: Secondary | ICD-10-CM | POA: Insufficient documentation

## 2011-06-04 DIAGNOSIS — Z1389 Encounter for screening for other disorder: Secondary | ICD-10-CM

## 2011-06-04 DIAGNOSIS — N402 Nodular prostate without lower urinary tract symptoms: Secondary | ICD-10-CM | POA: Insufficient documentation

## 2011-06-04 LAB — POCT URINALYSIS DIPSTICK
Blood,UA POCT: NEGATIVE
Glucose,UA POCT: NORMAL
Ketones,UA POCT: NEGATIVE
Leuk Esterase,UA POCT: NEGATIVE
Lot #: 21819501
Nitrite,UA POCT: NEGATIVE
PH,UA POCT: 7 (ref 5–8)
Specific gravity,UA POCT: 1.01 (ref 1.002–1.03)

## 2011-06-04 NOTE — Progress Notes (Signed)
This is a followup on Garrett Rodriguez who has history of mild nodular prostate with normal PSA and mild Peyronie disease. He is doing well. He has no significant urinary issues. He wakes up 0 to 1 time at night. He has mild hesitancy, but it does not bother him. Feels like emptying. He denies any dysuria. Recent PSA remains stable He has some mild Peyronie's with an upward bend unchanged. Does not affect his erections or ability for penetration.     ROS: Denies fevers, back pain, constipation    Current Outpatient Prescriptions   Medication Sig    enalapril (VASOTEC) 10 MG tablet TAKE 1 TABLET EVERY DAY    hydrochlorothiazide (HYDRODIURIL) 25 MG tablet TAKE 1 TABLET EVERY DAY    LEVOXYL 100 MCG tablet TAKE 1 TABLET EVERY DAY    aspirin 81 MG tablet TAKE 1 TABLET DAILY.       GENERAL:  No acute distress, well developed, well nourished  NEUROLOGIC:  Oriented to person, place, time, and situation  PSYCHIATRIC:  Normal mood and affect  HEENT:  Normocephalic, atraumatic.  Oropharynx clear.  Trachea midline.   SKIN:  Normal color, turgor, texture, hydration  LYMPHATIC:  Normal cervical, supraclavicular, and inguinal examination  BACK/ORTHO:  No costovertebral angle tenderness.  No tenderness of the axial skeleton.  No obvious back deformities.  ABDOMINAL:  Abdomen soft,nontender, nondistended, and without masses;    PULSES:  groin palpable bilaterally and symmetric   EXTREMITIES:  Without cyanosis, clubbing, or edema. Calves nontender  Hemorrhoids present. He has some mild asymmetry L>R with no distinct nodule noted.     PSA (eff. 07-2008)   Date Value Range Status   06/02/2011 2.95  0.00 - 4.00 ng/mL Final      Total PSA, (performed by the Roche Elecsys E170      electrochemiluminescence ECLIA) results are not absolute for      the presence or absence of malignant disease and the values      obtained with different assay methods or kits can not be used      interchangeably.   04/18/2010 2.32  0.00 - 4.00 ng/mL Final       Total PSA, (performed by the Roche Cobas 601      electrochemiluminescence ECLIA) results are not absolute for      the presence or absence of malignant disease and the values      obtained with different assay methods or kits can not be used      interchangeably.   07/06/2009 2.48  0.00-4.00 ng/mL Final      Total PSA, (performed by the Roche Cobas 601      electrochemiluminescence ECLIA) results are not absolute for      the presence or absence of malignant disease and the values      obtained with different assay methods or kits can not be used      interchangeably.     No new changes in last year. His PSA is stable, and his digital rectal exam continues to be slightly abnormal But no indication for prostate biopsy. We will continue to follow it. If there is any change, he understands he will need a biopsy. In regards to his Peyronie disease, we discussed observing this. I discussed some types of p.o. treatment but told him that they did not work very well. Patient does not want more aggressive management. At this juncture, we will follow him. Slight hesitancy discussed the benefit of alpha-blocker.  Does not  want this at this juncture. He will call me with any questions or concerns.     ASSESSMENT:   1. 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   2. Mild Peyronie's unchanged.   3. BPH- hesitancy    PLAN:   1. PSA/DRE/PVR in 1 year.   2. Observation of Peyronie's.  3. ? Alpha blocker

## 2011-06-10 ENCOUNTER — Telehealth: Payer: Self-pay | Admitting: Primary Care

## 2011-06-10 DIAGNOSIS — Z139 Encounter for screening, unspecified: Secondary | ICD-10-CM

## 2011-06-10 DIAGNOSIS — E039 Hypothyroidism, unspecified: Secondary | ICD-10-CM

## 2011-06-10 DIAGNOSIS — E7889 Other lipoprotein metabolism disorders: Secondary | ICD-10-CM

## 2011-06-10 NOTE — Telephone Encounter (Signed)
Orders done

## 2011-06-10 NOTE — Telephone Encounter (Signed)
Patient went to the lab but was told his October 2012 las had expired.  He is asking if you would please put through comprehensive,lipids,psa,tsh back in and he will get them done tomorrow and call after visit to follow up with you.

## 2011-06-10 NOTE — Telephone Encounter (Signed)
PT AWARE  

## 2011-06-11 ENCOUNTER — Ambulatory Visit
Admit: 2011-06-11 | Discharge: 2011-06-11 | Disposition: A | Discharge status: Home / Self Care | Payer: Self-pay | Source: Ambulatory Visit | Attending: Primary Care | Admitting: Primary Care

## 2011-06-11 DIAGNOSIS — Z139 Encounter for screening, unspecified: Secondary | ICD-10-CM

## 2011-06-11 DIAGNOSIS — E039 Hypothyroidism, unspecified: Secondary | ICD-10-CM

## 2011-06-11 DIAGNOSIS — E7889 Other lipoprotein metabolism disorders: Secondary | ICD-10-CM

## 2011-06-11 LAB — COMPREHENSIVE METABOLIC PANEL
ALT: 17 U/L (ref 0–50)
AST: 24 U/L (ref 0–50)
Albumin: 4.6 g/dL (ref 3.5–5.2)
Alk Phos: 71 U/L (ref 40–130)
Anion Gap: 10 (ref 7–16)
Bilirubin,Total: 0.5 mg/dL (ref 0.0–1.2)
CO2: 29 mmol/L — ABNORMAL HIGH (ref 20–28)
Calcium: 9.4 mg/dL (ref 8.6–10.2)
Chloride: 101 mmol/L (ref 96–108)
Creatinine: 1.24 mg/dL — ABNORMAL HIGH (ref 0.67–1.17)
GFR,Black: 71 *
GFR,Caucasian: 61 *
Glucose: 97 mg/dL (ref 60–99)
Lab: 13 mg/dL (ref 6–20)
Potassium: 4.4 mmol/L (ref 3.3–5.1)
Sodium: 140 mmol/L (ref 133–145)
Total Protein: 6.9 g/dL (ref 6.3–7.7)

## 2011-06-11 LAB — LIPID PANEL
Chol/HDL Ratio: 2.9
Cholesterol: 211 mg/dL — AB
HDL: 73 mg/dL
LDL Calculated: 124 mg/dL
Non HDL Cholesterol: 138 mg/dL
Triglycerides: 70 mg/dL

## 2011-06-11 LAB — PSA (EFF.4-2010): PSA (eff. 4-2010): 3.02 ng/mL (ref 0.00–4.00)

## 2011-06-11 LAB — TSH: TSH: 1.41 u[IU]/mL (ref 0.27–4.20)

## 2011-06-13 ENCOUNTER — Ambulatory Visit: Payer: Self-pay | Admitting: Primary Care

## 2011-06-13 ENCOUNTER — Encounter: Payer: Self-pay | Admitting: Primary Care

## 2011-06-13 VITALS — BP 120/78 | HR 56 | Temp 98.2°F | Resp 14 | Ht 66.0 in | Wt 197.2 lb

## 2011-06-13 DIAGNOSIS — E039 Hypothyroidism, unspecified: Secondary | ICD-10-CM

## 2011-06-13 DIAGNOSIS — I10 Essential (primary) hypertension: Secondary | ICD-10-CM

## 2011-06-13 DIAGNOSIS — H9319 Tinnitus, unspecified ear: Secondary | ICD-10-CM

## 2011-06-13 NOTE — Progress Notes (Signed)
Reason For Visit     Garrett Rodriguez in ears, thyroid, hypertension    HPI-   Past medical history medication list was reviewed and updated where appropriate     Patient has had ringing in his ears for the past 4 weeks.  No pain, no vertigo, no issues with headaches.  Neurologically, he has not had any focal deficits whatsoever.  He denies any issues with nausea or vomiting.    Patient has had underactive thyroid for many years, he feels stable with his current medication program.  No changes in the skin, hair, no issues with constipation.    Feels good regarding blood pressure management, specifically no issues with dizziness, no lightheadedness with standing.     Nonsmoker.    Current Outpatient Prescriptions   Medication Sig   . enalapril (VASOTEC) 10 MG tablet TAKE 1 TABLET EVERY DAY   . hydrochlorothiazide (HYDRODIURIL) 25 MG tablet TAKE 1 TABLET EVERY DAY   . LEVOXYL 100 MCG tablet TAKE 1 TABLET EVERY DAY   . aspirin 81 MG tablet TAKE 1 TABLET DAILY.     Filed Vitals:    06/13/11 1630   BP: 120/78   Pulse: 56   Temp: 36.8 C (98.2 F)   Resp: 14   Height: 1.676 m (5\' 6" )   Weight: 89.449 kg (197 lb 3.2 oz)     Patient Active Problem List   Diagnoses Code   . Hypothyroidism 244.9   . Graves' Disease 242.00   . Generalized Anxiety Disorder 300.02   . Esophageal Reflux 530.81   . Essential Hypertension 401.9   . Obstructive Sleep Apnea 327.23   . Nodular prostate 600.10   . Peyronie disease 607.85           ROS-  CONSTITUTIONAL: Appetite good, no fevers, night sweats or weight loss  CV: No chest pain, shortness of breath or peripheral edema  RESPIRATORY: No cough, wheezing or dyspnea  GI: No nausea/vomiting, abdominal pain, or change in bowel habits  GU: No dysuria, urgency or incontinence  NEURO: No MS changes, no motor weakness, no sensory changes.        Physical Exam-    GENERAL APPEARANCE: Appears stated age, well appearing, NAD  HEENT: PERRL, EOMI, TMs normal, oropharynx clear  NECK: good carotid upstrokes, without  bruit, no jugular venous distention, nontender thyroid  LUNGS: Clear to auscultation and percussion; he has normal respiratory effort  HEART: heart is regular in rate, no murmur or rub  ABDOMEN: NABS, soft, non-tender, without hepato-splenomegaly  EXTREMITIES: Without clubbing, cyanosis, or edema  NEUROLOGIC: Alert and oriented x3, no tremor.    Assessment-     Ringing in ears-he does not have any worrisome signs or symptoms such as hearing loss, vertigo, headaches or focal neurologic deficits.  This is probably idiopathic.  At this point in time we decided on observation unless he develops any of those worrisome symptoms.  In the meantime I recommended he avoid loud noises such as shooting firearms, et cetera.    Hypertension-blood pressure is excellent less than his stated goal of 140/90.  Continue low salt intake, aerobic exercise.  Kidney function is appropriate.    Hypothyroidism-he is stable, continue current therapy.  Followup 6 months

## 2011-07-21 ENCOUNTER — Ambulatory Visit: Payer: Self-pay | Admitting: Primary Care

## 2011-09-02 ENCOUNTER — Telehealth: Payer: Self-pay | Admitting: Primary Care

## 2011-09-02 MED ORDER — LEVOTHYROXINE SODIUM 100 MCG PO CAPS
ORAL_CAPSULE | ORAL | Status: DC
Start: 2011-09-02 — End: 2011-10-01

## 2011-09-02 NOTE — Telephone Encounter (Signed)
Pharmacy called and said his thyroid medication has been recalled and he needs a substitute pt is out of medication and is leaving   Wed. @ 6;30 am for [place].

## 2011-09-02 NOTE — Telephone Encounter (Signed)
I changed him to Levothyroxine 100 micrograms daily    Note to secretarial staff: please indicate actual NAME, STRENGTH and DOSAGE when documenting these messages instead of vague terms such as "thyroid medication" or "BP medication", etc.

## 2011-10-01 ENCOUNTER — Other Ambulatory Visit: Payer: Self-pay | Admitting: Primary Care

## 2011-10-01 MED ORDER — LEVOTHYROXINE SODIUM 100 MCG PO CAPS
ORAL_CAPSULE | ORAL | Status: DC
Start: 2011-10-01 — End: 2012-02-23

## 2011-10-03 ENCOUNTER — Telehealth: Payer: Self-pay | Admitting: Primary Care

## 2011-10-03 DIAGNOSIS — Z139 Encounter for screening, unspecified: Secondary | ICD-10-CM

## 2011-10-03 NOTE — Telephone Encounter (Signed)
We would have to refer him for formal genetic counseling to get this done (they may have a better chance for insurance purposes)    A screening mammogram is certainly worthwhile and I will put in the referral.

## 2011-10-03 NOTE — Telephone Encounter (Signed)
Patient has a strong family history of Breast CA.  He is trying to have genetic testing done to see if he carries the BRCA1-2 marker.  His insurance [employer] stated he needs information from his PCP office to have this done and get a quote on cost and coverage.  The patient states 2 of his sisters were diagnosed with breast cancer and have recentl had bilateral mastectomies. Trula Ore suggested he start with a mammogram on himself due to his strong family history.  Please contact the patient to advise him, he has a letter from his sister in [place] in his scanned media for reference.  Thank you=LLH

## 2011-10-06 NOTE — Telephone Encounter (Signed)
I left a message to have patient to call the office back to give Dr Sharene Skeans message below

## 2011-10-31 ENCOUNTER — Encounter: Payer: Self-pay | Admitting: Gastroenterology

## 2011-11-07 ENCOUNTER — Telehealth: Payer: Self-pay | Admitting: Primary Care

## 2011-11-07 DIAGNOSIS — Z139 Encounter for screening, unspecified: Secondary | ICD-10-CM

## 2011-11-07 DIAGNOSIS — D229 Melanocytic nevi, unspecified: Secondary | ICD-10-CM

## 2011-11-07 NOTE — Telephone Encounter (Signed)
Spoke with patient and let him know we would be sending off the referrals and they would be contacting him

## 2011-11-07 NOTE — Telephone Encounter (Signed)
Call pt:    Referral made to derm and genetics for him

## 2011-11-07 NOTE — Telephone Encounter (Signed)
Pt calls regarding two referrals.   1. States that he has discussed with you a referral to genetic testing for the brca gene. Did you want to refer him for testing?  2. Pt states he has two dark spots on his forehead that are getting larger and he is interested in a referral to dermatology. Secretaries are to find out if pt needs an insurance referral for derm.

## 2012-01-02 ENCOUNTER — Ambulatory Visit
Admit: 2012-01-02 | Discharge: 2012-01-02 | Disposition: A | Discharge status: Home / Self Care | Payer: Self-pay | Source: Ambulatory Visit | Attending: Pediatric Genetics | Admitting: Pediatric Genetics

## 2012-01-02 ENCOUNTER — Ambulatory Visit: Payer: Self-pay | Admitting: Pediatric Genetics

## 2012-01-02 ENCOUNTER — Encounter: Payer: Self-pay | Admitting: Gastroenterology

## 2012-01-02 ENCOUNTER — Encounter: Payer: Self-pay | Admitting: Pediatric Genetics

## 2012-01-02 DIAGNOSIS — Z809 Family history of malignant neoplasm, unspecified: Secondary | ICD-10-CM | POA: Insufficient documentation

## 2012-01-02 NOTE — Patient Instructions (Signed)
Testing for familial BRCA mutation

## 2012-01-02 NOTE — Progress Notes (Signed)
Reason for Visit : It was a pleasure to see your patient Garrett Rodriguez in the Division of Genetics at the [facility] on January 02, 2012.  The patient was referred to discuss a family history of cancer and familial BRCA 1 mutation.    Chief Complaint : Family history of cancer; relative with BRCA 1 mutation    HPI :  He is interested in genetic counseling and possibly genetic testing.    Family history :   A three generation pedigree was obtained at the visit.  Pertinent family history includes:    Sister - breast cancer diagnosed age 68; BRCA 1 mutation ([id number])  Paternal grandmother - breast cancer; died age 40    Maternal uncle - ?liver or pancreatic cancer; deceased    The family is of [place], [place] and [place] ancestry.    There is no additional known history of cancer on either side of the patient's family.  Pathology results were not available for our review at the time of the counseling session.    Personal history :   [name] is cancer free at the age of 66    Assessment :  I spent 45 minutes with this patient, >50% of which was in counseling and coordination of care.  Discussed with patient -  Personal history  Family history  Basic overview of cancer  Sporadic vs. Familial/Inherited cancer  Autosomal Dominant Inheritance  Risks associated with specific mutations  Outcome of testing  Estimated risk for positive result  Implication of result for other family members  Management options    Tests :   Known familial mutation testing for the previously identified BRCA 1 mutation only    Counseling/Education:  We discussed the risks, benefits and limitations of testing.  We also discussed the issues of confidentiality.  Records and results will be forwarded to those physicians designated by the patient if testing is pursued.    Single Site  We reviewed the possible outcome of testing.  If your patient pursues testing and is found to have a mutation in the BRCA1 gene,  he will be at increased risk for the associated cancers.  If he is tested and found to be negative for the mutation previously identified in the family, he will likely face the same risks for cancer as a male at age 30 in the general population.    We reviewed autosomal dominant inheritance and the 1 in 2 (50%) chance of carrying the mutation previously identified in the patient's sister.      Plan :   Garrett Rodriguez elected to proceed with testing today and blood was drawn directly following the counseling session.  An in-person results disclosure session will be scheduled.    It was a pleasure meeting with your patient.  Please do not hesitate to contact our office at [phone] if we may be of additional assistance.      Felicity Pellegrini, M.D.  Division of [employer]

## 2012-01-13 ENCOUNTER — Ambulatory Visit: Payer: Self-pay | Admitting: Dermatology

## 2012-01-13 ENCOUNTER — Encounter: Payer: Self-pay | Admitting: Dermatology

## 2012-01-13 VITALS — BP 119/70 | Ht 66.0 in | Wt 198.0 lb

## 2012-01-13 DIAGNOSIS — D229 Melanocytic nevi, unspecified: Secondary | ICD-10-CM

## 2012-01-13 DIAGNOSIS — L814 Other melanin hyperpigmentation: Secondary | ICD-10-CM

## 2012-01-13 DIAGNOSIS — L821 Other seborrheic keratosis: Secondary | ICD-10-CM

## 2012-01-13 NOTE — Progress Notes (Addendum)
Consulting MD: Earl Many, MD    CC: two brown spots on forehead; FSE    HPI: Pt is a 66 y.o. Caucasian male who presents for the first time to dermatology clinic with the above complaints. He has noticed two brown spots on his forehead that have been enlarging over the past few months. They do not bleed or itch. They are not painful. He denies a personal history of skin cancer, but he does report a history of having a few moles removed from his back as a child. He is unsure about why they were removed, but he did not need any subsequent treatment. He has a sister and grandmother with breast cancer- his sister is BRCA 1 positive. He is being tested for this, but does not yet have the results.     Patient reports wearing sunscreen routinely. He also wears a hat when in the sun. He had shingles 2-3 years ago across his left upper back, which has led to occasional pruritus in the affected area. He has no residual pain. Denies unintended weight loss or night sweats.    ROS: Pt is otherwise in normal state of health    Allergies:  Allergies   Allergen Reactions   . No Known Drug Allergy      Created by Conversion - 0;    . No Known Latex Allergy      Created by Conversion - 0;        PMH:   Patient Active Problem List   Diagnosis Code   . Hypothyroidism 244.9   . Graves' Disease 242.00   . Generalized Anxiety Disorder 300.02   . Esophageal Reflux 530.81   . Essential Hypertension 401.9   . Obstructive Sleep Apnea 327.23   . Nodular prostate 600.10   . Peyronie disease 607.85   . Family history of cancer V16.9     Current Outpatient Prescriptions on File Prior to Visit   Medication Sig Dispense Refill   . Multiple Vitamin (MULTIVITAMIN) TABS Take by mouth       . Glucosamine-Chondroitin (GLUCOSAMINE CHONDR COMPLEX PO) Take by mouth       . Levothyroxine Sodium 100 MCG CAPS Take one daily  30 capsule  5   . enalapril (VASOTEC) 10 MG tablet TAKE 1 TABLET EVERY DAY  90 tablet  4   . hydrochlorothiazide (HYDRODIURIL)  25 MG tablet TAKE 1 TABLET EVERY DAY  90 tablet  3   . aspirin 81 MG tablet TAKE 1 TABLET DAILY.    0     No current facility-administered medications on file prior to visit.       FH:  Family History   Problem Relation Age of Onset   . Cancer Paternal Grandmother      Breast   . Cancer Sister      Breast       SocH:  History     Social History   . Marital Status: Married     Spouse Name: N/A     Number of Children: N/A   . Years of Education: N/A     Occupational History   . Not on file.     Social History Main Topics   . Smoking status: Never Smoker    . Smokeless tobacco: Never Used   . Alcohol Use: Not on file   . Drug Use: Not on file   . Sexually Active: Not on file     Other Topics Concern   .  Not on file     Social History Narrative   . No narrative on file         PE  Filed Vitals:    01/13/12 1044   BP: 119/70   Height: 1.676 m (5\' 6" )   Weight: 89.812 kg (198 lb)     General: Awake and alert, adult male in NAD, well nourished  Examination of the following was WNL unless otherwise noted-  -Face/Neck/Scalp:   - On the central forehead, there is a brown, stuck-on papule with psuedo horn cysts upon dermoscopy   - Over the left forehead, there is a tan, evenly pigmented macule  -Chest/Abdomen/Back:   - Over the left upper back, there is an atrophic white plaque with some central pink-purple areas within in c/w scar  -BUE/hands:  -BLE/feet:  -Buttocks/Groin:  -Nails/Hair:   * Throughout there are numerous tan to brown, evenly pigmented macules   * There are no signs of cutaneous malignancy upon exam today    Barriers to learning: None    Assessment/Plan:    1) Nevi, numerous benign  -Diagnosis discussed  -Patient reassured  -No treatment needed at this time  -Counseled re: wearing sunscreen and protective clothing; monitoring moles based on ABCDE criteria  -Advised patient that should he find out he is BRCA2+, he should definitely have an eye exam; if BRCA1+, he should also have an eye exam, though the possible  association between this gene and melanoma is not well established  -Advised patient to monitor his own moles and to have PCP monitor them annually; he can follow up with derm for FSE prn    2) Seborrheic keratosis, forehead (and throughout)  -Diagnosis discussed  -Patient reassured  -No treatment needed at this time    3) Solar lentigo (forehead and on b/l arms)  -Diagnosis discussed  -Patient reassured  -No treatment needed at this time    4) Scar, left upper back (patient does not remember where it came from, possibly from biopsy as a child)  - advised patient to monitor lesion; if changes, return to clinic for evaluation    RTC PRN    Cresenciano Genre, MD    I saw and evaluated the patient.  I agree with the resident's findings and plan of care as documented above.  I was present for key and critical portions of the procedure and immediately available.

## 2012-01-13 NOTE — Patient Instructions (Addendum)
1) Skin exam  - lesions on your forehead are benign; one is called a seborrheic keratosis, the other is a solar lentigo (liver spot)  - If BRCA 2 positive, we strongly recommend an eye exam given possible association with uveal melanoma; if BRCA 1 positive, we also recommend an eye exam (to look for any melanoma in the eye), though this is less strong of a connection than BRCA 2  - no signs of cutaneous malignancy upon exam today  - Keep an eye on the scar on your back; if it changes considerably, please come back to see [place] (have your wife look at it and take a picture for monitoring)  - We recommend monitoring moles (monitor then your self monthly and have your PCP keep an eye on then annually); can return to [place] PRN, but we do recommend periodic monitoring of moles  - Continue to wear sunscreen SPF 30 or greater, protective clothing when in sun    Monitor moles for the following changes:     A: asymmetry  B: border changes  C: color changes   D: diameter greater than a pencil eraser size   E: evolving, or changing mole       Follow up as needed

## 2012-01-22 ENCOUNTER — Telehealth: Payer: Self-pay

## 2012-01-22 ENCOUNTER — Telehealth: Payer: Self-pay | Admitting: Primary Care

## 2012-01-22 NOTE — Telephone Encounter (Signed)
A message was left requesting that the patient return my call. The patient's BRCA1 testing was negative.

## 2012-01-22 NOTE — Telephone Encounter (Signed)
Spoke with patient to review results of BRCA testing.  Site-specific BRCA 1 testing for the known familial mutation was completed and negative (no mutation detected).  All questions were answered and results follow-up offered.  He will call if he wishes to schedule an appointment.  Results will be faxed to providers and mailed to patient.

## 2012-01-22 NOTE — Telephone Encounter (Signed)
The patient has been informed.

## 2012-02-11 ENCOUNTER — Other Ambulatory Visit: Payer: Self-pay | Admitting: Primary Care

## 2012-02-23 ENCOUNTER — Other Ambulatory Visit: Payer: Self-pay | Admitting: Primary Care

## 2012-02-23 MED ORDER — LEVOTHYROXINE SODIUM 100 MCG PO CAPS
ORAL_CAPSULE | ORAL | Status: DC
Start: 2012-02-23 — End: 2012-03-29

## 2012-03-26 ENCOUNTER — Ambulatory Visit: Payer: Self-pay | Admitting: Primary Care

## 2012-03-26 ENCOUNTER — Encounter: Payer: Self-pay | Admitting: Primary Care

## 2012-03-26 VITALS — BP 112/64 | HR 56 | Temp 98.0°F | Ht 66.0 in | Wt 190.6 lb

## 2012-03-26 DIAGNOSIS — E039 Hypothyroidism, unspecified: Secondary | ICD-10-CM

## 2012-03-26 DIAGNOSIS — M79605 Pain in left leg: Secondary | ICD-10-CM

## 2012-03-26 DIAGNOSIS — Z0000 Encounter for general adult medical examination without abnormal findings: Secondary | ICD-10-CM

## 2012-03-26 MED ORDER — PREDNISONE 10 MG PO TABS *I*
ORAL_TABLET | ORAL | Status: DC
Start: 2012-03-26 — End: 2012-07-16

## 2012-03-26 NOTE — Progress Notes (Signed)
Reason For Visit     Annual health maintenance review    Left leg pain    HPI     Patient is here today for annual health maintenance assessment.  And a separately identifiable problem, he is also having burning sensation in the left foot and leg.    His history of Graves' disease which was treated, he has been a left hypothyroid ever since.  He feels well, has good energy level, denies any changes in skin or hair.    He has had mild anxiety but no depression.  He does very well from this perspective, has had good sleep hygiene, exercises every day and feels well from that standpoint.    His acid reflux has been asymptomatic, he has not had any problems with breakthrough abdominal pain, blood in the stool, unexplained diarrhea, or any issues with nausea or vomiting.    From a cardiac standpoint, his blood pressure has been outstanding, he has not had dizziness.  He plays hockey several days a week, very heavy aggressively, has not had heaviness in the chest or shortness of breath.    His only issue today, and a separately identifiable problem, as burning pain in the left buttock which radiates into his left foot and ankle.  He describes it as a burning sensation, and it's worse when he sitting for long intervals on his left leg.  When he changes position such as in bed, in improved.  He has tried various anti-inflammatories over-the-counter including Advil but no improvement.  He does not have lower back pain, fever or chills, and there was no trauma.  He has not had any problems with groin pain or penile discharge.    Patient Active Problem List   Diagnosis Code   . Hypothyroidism 244.9   . Graves' Disease 242.00   . Generalized Anxiety Disorder 300.02   . Esophageal Reflux 530.81   . Essential Hypertension 401.9   . Obstructive Sleep Apnea 327.23   . Nodular prostate 600.10   . Peyronie disease 607.85   . Family history of cancer V16.9     Current Outpatient Prescriptions   Medication Sig   . predniSONE  (DELTASONE) 10 MG tablet 4 tabs PO daily/2 days; 3 tabs daily/2 days; 2 tabs daily/2 days; one tabs daily/2 days; 1/2 tab daily/2 days   . Levothyroxine Sodium 100 MCG CAPS Take one daily   . hydrochlorothiazide (HYDRODIURIL) 25 MG tablet TAKE 1 TABLET EVERY DAY   . Multiple Vitamin (MULTIVITAMIN) TABS Take by mouth   . Glucosamine-Chondroitin (GLUCOSAMINE CHONDR COMPLEX PO) Take by mouth   . enalapril (VASOTEC) 10 MG tablet TAKE 1 TABLET EVERY DAY   . aspirin 81 MG tablet TAKE 1 TABLET DAILY.     No current facility-administered medications for this visit.     Filed Vitals:    03/26/12 1431   BP: 112/64   Pulse: 56   Temp: 36.7 C (98 F)   Height: 1.676 m (5\' 6" )   Weight: 86.456 kg (190 lb 9.6 oz)       Family Hx - sister was recently diagnosed with breast cancer    Personal Hx - does not smoke, drinks a few beers per week, nothing excessive, happily married    ROS   CONSTITUTIONAL: Appetite good, no fevers, night sweats or weight loss  EYES: No visual changes, no eye pain  ENT: No hearing difficulties, no ear pain  CV: No chest pain, shortness of breath or peripheral edema  RESPIRATORY: No cough, wheezing or dyspnea  GI: No nausea/vomiting, abdominal pain, or change in bowel habits  GU: No dysuria, urgency or incontinence  SKIN: No rashes  NEURO: No MS changes, no motor weakness, no sensory changes  PSYCH: No depression or anxiety  ENDOCRINE: No polyuria/polydipsia, no heat intolerance  HEME/LYMPH: No easy bleeding/bruising or swollen nodes  ALL/IMMUN: No allergic reactions.      Physical Exam     GENERAL APPEARANCE: Normal habitus. Well developed, well groomed. Appears stated age. No acute distress. Color good.  MENTAL STATUS: Appears alert and oriented. Affect appropriate.   SKIN: Skin color and turgor normal. No suspicious lesions, masses, rashes, or ulcerations. Nails and hair appear normal.   HEAD: Normocephalic.  EARS: External ear w/o scars, masses, or lesions. External auditory canal intact, clear, and  w/o lesions. TMs intact with normal light reflex and landmarks. Acuity to conversational tones good.   EYES: PERRLA, EOMI. Lids w/o defect  NOSE: Nasal mucosa and turbinates pink, septum midline, no lesions.  MOUTH: Teeth in good repair. Gums pink w/o lesions. Normal appearing mucosa, palate, and tongue.   OROPHARYNX: Moist w/o exudate, erythema, or swelling.   NECK: Symmetric, trachea midline. Full ROM w/o pain or tenderness. Thyroid nontender w/o enlargement or masses. Carotid pulses normal with no bruits. No cervical lymphadenopathy.   CHEST: Respirations unlabored with normal diaphragmatic excursion. Chest wall symmetric with no masses. Breath sounds clear bilaterally w/o wheezes, rubs, rales, or rhonchi.   CV: Normal precordium and PMI w/o lifts, heaves, or thrills.  bradycardic in rate but regular, no murmur   ABDOMEN: Abdomen soft with normal bowel sounds. No guarding or rebound. No palpable masses or tenderness. Liver and spleen are w/o tenderness or enlargement. No aortic widening. No inguinal adenopathy.   GU: Normal penis, testes, and cords bilaterally, without masses or tenderness. No penile discharge or ulcerations. Prostate 1+ was soft and smooth   MS: excellent pedal pulses, he has a positive straight leg raise on the left side, no bony tenderness in the back with normal range of motion, no palpable mass in the hip or groin    NEUROLOGICAL: Cranial nerves II-XII intact. Motor strength symmetrical with no obvious weaknesses. Superficial sensation intact bilaterally to light touch and pain. Observed dexterity w/o ataxia or tremor. Deep tendon reflexes full and symmetric bilaterally. Gait coordinated and smooth.    EKG: Sinus bradycardia     Assessment     Routine health maintenance-we reviewed the importance of having advanced directives and a health care proxy.  He has excellent sleep hygiene, he is negative for depression.  Lower his for falling, he does very well with aerobic activity at least 100  minutes per week.  Advised on low salt intake and of course a well-rounded diet.  His prostate exam shows enlargement but it feels symmetrical there is no ulcerations or nodules on clinical exam.  We will check a PSA, understanding there is some limitations with the testing.  He received a flu shot today.    Mild acid reflux-continue current medical management.  Up-to-date with colorectal cancer screening.    Hypertension-nothing suggestive of ischemia on his EKG, and his bradycardia is asymptomatic.  He is at target less than 140/90.  Continue current medical management, of course diarrhea aerated the low salt intake was important.  We will check sugar and kidney function.    Left leg pain-suggestive of sciatica and I think he has impingement in the left buttock area.  He does  not have true weakness in the leg, fevers, loss of function, slight think a more ominous process is highly unlikely such as malignancy, infection, or severe herniated disc.  I like to put him on prednisone 40/30/20/10/5 mg with each dose for 2 days duration to hopefully improve nerve root impingement.  I acid gave him instruction on stretching.  Followup if no better.    Coun/Edu    --Health care proxy discussed   --Diet/body weight discussed  --Aerobic exercise discussed  --Alcohol use discussed  --Colon CA screening discussed.  --Testicular self exam discussed  --Prostate cancer screening discussed   --Mammogram screening discussed  --Skin CA awareness/prevention discussed  --Dental care discussed  --Cardiac risk factor modification discussed.

## 2012-03-27 ENCOUNTER — Ambulatory Visit
Admit: 2012-03-27 | Discharge: 2012-03-27 | Disposition: A | Discharge status: Home / Self Care | Payer: Self-pay | Source: Ambulatory Visit | Attending: Primary Care | Admitting: Primary Care

## 2012-03-27 DIAGNOSIS — E039 Hypothyroidism, unspecified: Secondary | ICD-10-CM

## 2012-03-27 DIAGNOSIS — Z0000 Encounter for general adult medical examination without abnormal findings: Secondary | ICD-10-CM

## 2012-03-27 LAB — LIPID PANEL
Chol/HDL Ratio: 2.4
Cholesterol: 201 mg/dL — AB
HDL: 84 mg/dL
LDL Calculated: 104 mg/dL
Non HDL Cholesterol: 117 mg/dL
Triglycerides: 65 mg/dL

## 2012-03-27 LAB — COMPREHENSIVE METABOLIC PANEL
ALT: 24 U/L (ref 0–50)
AST: 24 U/L (ref 0–50)
Albumin: 4.5 g/dL (ref 3.5–5.2)
Alk Phos: 69 U/L (ref 40–130)
Anion Gap: 11 (ref 7–16)
Bilirubin,Total: 0.4 mg/dL (ref 0.0–1.2)
CO2: 29 mmol/L — ABNORMAL HIGH (ref 20–28)
Calcium: 9.3 mg/dL (ref 8.6–10.2)
Chloride: 100 mmol/L (ref 96–108)
Creatinine: 1.22 mg/dL — ABNORMAL HIGH (ref 0.67–1.17)
GFR,Black: 72 *
GFR,Caucasian: 62 *
Glucose: 104 mg/dL — ABNORMAL HIGH (ref 60–99)
Lab: 22 mg/dL — ABNORMAL HIGH (ref 6–20)
Potassium: 5 mmol/L (ref 3.3–5.1)
Sodium: 140 mmol/L (ref 133–145)
Total Protein: 6.6 g/dL (ref 6.3–7.7)

## 2012-03-27 LAB — PSA (EFF.4-2010): PSA (eff. 4-2010): 2.87 ng/mL (ref 0.00–4.00)

## 2012-03-27 LAB — TSH: TSH: 1.21 u[IU]/mL (ref 0.27–4.20)

## 2012-03-29 ENCOUNTER — Other Ambulatory Visit: Payer: Self-pay | Admitting: Primary Care

## 2012-03-29 MED ORDER — LEVOTHYROXINE SODIUM 100 MCG PO CAPS
ORAL_CAPSULE | ORAL | Status: DC
Start: 2012-03-29 — End: 2012-05-27

## 2012-03-29 NOTE — Telephone Encounter (Signed)
Pt looking for a 90 day supply

## 2012-05-27 ENCOUNTER — Other Ambulatory Visit: Payer: Self-pay | Admitting: Primary Care

## 2012-05-27 MED ORDER — LEVOTHYROXINE SODIUM 100 MCG PO CAPS
ORAL_CAPSULE | ORAL | Status: AC
Start: 2012-05-27 — End: ?

## 2012-05-27 MED ORDER — ENALAPRIL MALEATE 10 MG PO TABS *I*
10.0000 mg | ORAL_TABLET | Freq: Every day | ORAL | Status: DC
Start: 2012-05-27 — End: 2021-10-12

## 2012-05-27 MED ORDER — HYDROCHLOROTHIAZIDE 25 MG PO TABS *I*
25.0000 mg | ORAL_TABLET | Freq: Every day | ORAL | Status: AC
Start: 2012-05-27 — End: ?

## 2012-05-31 ENCOUNTER — Ambulatory Visit
Admit: 2012-05-31 | Discharge: 2012-05-31 | Disposition: A | Discharge status: Home / Self Care | Payer: Self-pay | Source: Ambulatory Visit | Attending: Urology | Admitting: Urology

## 2012-05-31 DIAGNOSIS — Z1389 Encounter for screening for other disorder: Secondary | ICD-10-CM

## 2012-05-31 LAB — PSA (EFF.4-2010): PSA (eff. 4-2010): 3.18 ng/mL (ref 0.00–4.00)

## 2012-06-02 ENCOUNTER — Ambulatory Visit: Payer: Self-pay | Admitting: Urology

## 2012-06-02 ENCOUNTER — Encounter: Payer: Self-pay | Admitting: Urology

## 2012-06-02 VITALS — BP 147/87 | HR 61 | Ht 66.0 in | Wt 190.0 lb

## 2012-06-02 DIAGNOSIS — N402 Nodular prostate without lower urinary tract symptoms: Secondary | ICD-10-CM

## 2012-06-02 DIAGNOSIS — N486 Induration penis plastica: Secondary | ICD-10-CM

## 2012-06-02 DIAGNOSIS — Z1389 Encounter for screening for other disorder: Secondary | ICD-10-CM

## 2012-06-02 LAB — POCT URINALYSIS DIPSTICK
Blood,UA POCT: NEGATIVE
Glucose,UA POCT: NORMAL
Ketones,UA POCT: NEGATIVE
Leuk Esterase,UA POCT: NEGATIVE
Lot #: 22273601
Nitrite,UA POCT: NEGATIVE
PH,UA POCT: 7 (ref 5–8)
Protein,UA POCT: NEGATIVE mg/dL
Specific gravity,UA POCT: 1.01 (ref 1.002–1.03)

## 2012-06-02 NOTE — Progress Notes (Signed)
This is a followup on Garrett Rodriguez who has history of mild nodular prostate with normal PSA and mild Peyronie disease. No change in urination in last year. Stream strength is slow but feels like emptying. Nocturia 0-1x.  5-6 cups of coffee.  Feels like emptying. He denies any dysuria. Recent PSA remains stable He has some mild Peyronie's with an upward bend unchanged- able to penetrate     ROS: Denies fevers, back pain, constipation    Current Outpatient Prescriptions   Medication Sig   . Levothyroxine Sodium 100 MCG CAPS Take one daily   . hydrochlorothiazide (HYDRODIURIL) 25 MG tablet Take 1 tablet (25 mg total) by mouth daily   . enalapril (VASOTEC) 10 MG tablet Take 1 tablet (10 mg total) by mouth daily   . predniSONE (DELTASONE) 10 MG tablet 4 tabs PO daily/2 days; 3 tabs daily/2 days; 2 tabs daily/2 days; one tabs daily/2 days; 1/2 tab daily/2 days   . Multiple Vitamin (MULTIVITAMIN) TABS Take by mouth   . Glucosamine-Chondroitin (GLUCOSAMINE CHONDR COMPLEX PO) Take by mouth   . aspirin 81 MG tablet TAKE 1 TABLET DAILY.     BP 147/87  Pulse 61  Ht 1.676 m (5\' 6" )  Wt 86.183 kg (190 lb)  BMI 30.68 kg/m2    GENERAL:  No acute distress, well developed, well nourished  NEUROLOGIC:  Oriented to person, place, time, and situation  PSYCHIATRIC:  Normal mood and affect  HEENT:  Normocephalic, atraumatic.  Oropharynx clear.  Trachea midline.   SKIN:  Normal color, turgor, texture, hydration  LYMPHATIC:  Normal cervical, supraclavicular, and inguinal examination  BACK/ORTHO:  No costovertebral angle tenderness.  No tenderness of the axial skeleton.  No obvious back deformities.  ABDOMINAL:  Abdomen soft,nontender, nondistended, and without masses;  Right inguinal hernia  PULSES:  groin palpable bilaterally and symmetric   EXTREMITIES:  Without cyanosis, clubbing, or edema. Calves nontender  Hemorrhoids present. He has some mild asymmetry L>R with no distinct nodule noted.     PSA (eff. 07-2008)   Date Value Range  Status   05/31/2012 3.18  0.00 - 4.00 ng/mL Final      Total PSA, (performed by the Roche Elecsys E170      electrochemiluminescence ECLIA) results are not absolute for      the presence or absence of malignant disease and the values      obtained with different assay methods or kits can not be used      interchangeably.   03/27/2012 2.87  0.00 - 4.00 ng/mL Final      Total PSA, (performed by the Roche Cobas 601      electrochemiluminescence ECLIA) results are not absolute for      the presence or absence of malignant disease and the values      obtained with different assay methods or kits can not be used      interchangeably.   06/11/2011 3.02  0.00 - 4.00 ng/mL Final      Total PSA, (performed by the Roche Elecsys E170      electrochemiluminescence ECLIA) results are not absolute for      the presence or absence of malignant disease and the values      obtained with different assay methods or kits can not be used      interchangeably.     GU issues currently stable.  DRE and PSA remain unchanged.  Will check yearly.  No indication for prostate biopsy. Peyronie's unchanged.  Discussed  new options for treatment. Does not affect him and will follow.  Continued urinary hesitancy but not too problematic.  Will hold off on medication. Told if decreasing caffeine, this may help.  Will continue to see yearly. He will call me with any questions or concerns.     ASSESSMENT:   1. 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   2. Mild Peyronie's unchanged.   3. BPH- hesitancy    PLAN:   1. PSA/DRE/PVR in 1 year.   2. Observation of Peyronie's.  3. Limit caffeine

## 2012-07-16 ENCOUNTER — Encounter: Payer: Self-pay | Admitting: Primary Care

## 2012-07-16 ENCOUNTER — Ambulatory Visit
Admit: 2012-07-16 | Discharge: 2012-07-16 | Disposition: A | Discharge status: Home / Self Care | Payer: Self-pay | Source: Ambulatory Visit | Admitting: Primary Care

## 2012-07-16 ENCOUNTER — Ambulatory Visit: Payer: Self-pay | Admitting: Primary Care

## 2012-07-16 VITALS — BP 122/80 | HR 68 | Temp 98.0°F | Resp 16 | Ht 66.0 in | Wt 196.0 lb

## 2012-07-16 DIAGNOSIS — M549 Dorsalgia, unspecified: Secondary | ICD-10-CM

## 2012-07-16 MED ORDER — GABAPENTIN 300 MG PO CAPS
300.0000 mg | ORAL_CAPSULE | Freq: Every evening | ORAL | Status: DC
Start: 2012-07-16 — End: 2012-08-04

## 2012-07-16 NOTE — Progress Notes (Signed)
Reason For Visit     Burning pain in left calf, left thigh and foot    HPI-   Past medical history medication list was reviewed and updated where appropriate     Very pleasant 67 year old gentleman who is had a few months of worsening pain in his left calf, slightly lateral, also occasionally starting in the left posterior aspect of his upper leg and he's also had intermittent pain in his back.  The pain is burning, but not to the point that he cannot function.  Typically around 4/10 in severity.  He has not had a foot drop, he has not had any issues with urinary or fecal incontinence, involuntary weight loss, lower domino pain, or any severe risk factors for vascular disease or infection in the abdomen.  He has been treated on occasion for prostate issues, but nothing recently and he denies any pain in the flank region near the kidneys.     Nonsmoker.    Patient Active Problem List   Diagnosis Code   . Hypothyroidism 244.9   . Graves' Disease 242.00   . Generalized Anxiety Disorder 300.02   . Esophageal Reflux 530.81   . Essential Hypertension 401.9   . Obstructive Sleep Apnea 327.23   . Nodular prostate 600.10   . Peyronie disease 607.85   . Family history of cancer V16.9     Current Outpatient Prescriptions   Medication Sig   . gabapentin (NEURONTIN) 300 MG capsule Take 1 capsule (300 mg total) by mouth nightly   . Levothyroxine Sodium 100 MCG CAPS Take one daily   . hydrochlorothiazide (HYDRODIURIL) 25 MG tablet Take 1 tablet (25 mg total) by mouth daily   . enalapril (VASOTEC) 10 MG tablet Take 1 tablet (10 mg total) by mouth daily   . Multiple Vitamin (MULTIVITAMIN) TABS Take by mouth   . Glucosamine-Chondroitin (GLUCOSAMINE CHONDR COMPLEX PO) Take by mouth   . aspirin 81 MG tablet TAKE 1 TABLET DAILY.     No current facility-administered medications for this visit.     Filed Vitals:    07/16/12 1636   BP: 122/80   Pulse: 68   Temp: 36.7 C (98 F)   Resp: 16   Height: 1.676 m (5\' 6" )   Weight: 88.905 kg (196  lb)             ROS-  CONSTITUTIONAL: Appetite good, no fevers, night sweats or weight loss  CV: No chest pain, shortness of breath or history of DVT.  He has had superficial phlebitis in his right leg after trauma from a hockey puck  GI: No nausea/vomiting, abdominal pain        Physical Exam-    GENERAL APPEARANCE: Appears stated age, well appearing, NAD; walks without a limp  HEART: heart is regular in rate, no murmur or rub  ABDOMEN: NABS, soft, non-tender, without hepato-splenomegaly or pulsatile mass over midline  EXTREMITIES: Normal straight leg raise, no bony tenderness over midline of the back, slightly decreased side bending.  Sensory exam slightly diminished in the L5 dermatome but to be honest it was not that significant.  Normal strength with toe walking and heel walking.  No foot drop.  Normal ankle jerk, normal patellar reflexes.  No atrophy.      Assessment-       Back pain with radiculopathy-his symptoms seem to be compatible subjectively with an L5 radiculopathy, most likely herniated disc or nerve impingement in the lumbar spine.  In the absence of  fever or chills, abdominal pain, weight loss, or urinary or fecal incontinence he does not have any red flags which would suggest something more ominous such as cauda equina syndrome, infection, malignancy, et Karie Soda.  We will refer him to the orthopedic Department through Dr. Levester Fresh for the Fast Track program.  In the interim we'll start gabapentin 300 mg once daily.

## 2012-08-04 ENCOUNTER — Ambulatory Visit: Payer: Self-pay | Admitting: Physical Medicine and Rehabilitation

## 2012-08-04 ENCOUNTER — Encounter: Payer: Self-pay | Admitting: Physical Medicine and Rehabilitation

## 2012-08-04 VITALS — BP 139/74 | HR 61 | Temp 99.0°F | Ht 66.0 in | Wt 192.0 lb

## 2012-08-04 DIAGNOSIS — M5440 Lumbago with sciatica, unspecified side: Secondary | ICD-10-CM

## 2012-08-04 DIAGNOSIS — M4317 Spondylolisthesis, lumbosacral region: Secondary | ICD-10-CM | POA: Insufficient documentation

## 2012-08-04 MED ORDER — GABAPENTIN 300 MG PO CAPS
ORAL_CAPSULE | ORAL | Status: AC
Start: 2012-08-04 — End: 2012-11-02

## 2012-08-04 NOTE — Progress Notes (Signed)
Dear Dr. Durwin Reges, Cristal Deer, MD,     Garrett Rodriguez was seen on 08/04/2012 at the [facility].    Chief complaint: Left leg pain    History of present illness:Garrett Rodriguez is a 67 y.o. male with complaints of left leg pain since November 2013.  The patient has a history of chronic intermittent back pain.  In the distant past he was diagnosed with a spondylolisthesis.  Overall he is been doing relatively well remaining active in non-check hockey, and doing things with his grandchildren.  This pain however is been a bit more problematic for him recently especially with the prolonged standing and walking.  As this is impacting his activity level he has become a bit frustrated that it is not resolved.     Exacerbating factors: Standing, walking     Relieving factors: Sitting, lying down    Prior treatment: Gabapentin and prednisone    Past Medical History   Diagnosis Date   . Thyroid disease    . Hypertension    . Varicella    . GERD (gastroesophageal reflux disease)    . Spondylolisthesis ?? 1975 ??     no documentation. Dr. Nelida Gores. Maybe via CT? d/n recall     Past Surgical History   Procedure Laterality Date   . Appendectomy       History     Social History   . Marital Status: Married     Spouse Name: N/A     Number of Children: N/A   . Years of Education: N/A     Occupational History   . Not on file.     Social History Main Topics   . Smoking status: Never Smoker    . Smokeless tobacco: Never Used   . Alcohol Use: Yes     7 Glasses of wine, 7 Cans of beer, 0 Shots of liquor, 0 Drinks containing 0.5 oz of alcohol per week   . Drug Use: No   . Sexually Active: Yes -- Male partner(s)     Birth Control/ Protection: None     Other Topics Concern   . Not on file     Social History Narrative   . No narrative on file     Family History   Problem Relation Age of Onset   . Cancer Sister      Breast   . Cancer Sister      had bilateral masectomy   . Cancer Paternal Grandmother         Review of systems:     General: Healthy []  Recent weight loss []  Recent weight gain[]  Fatigue []   MSK: pain in multiple joints []  joint swelling []   Neuro: Numbness/tingling []  weakness []  seizures []   ID: Recent illness []  Fever []  Chills []   GU: Urinary retention []  Urinary frequency []  Pin with urination []   GI: Constipation []  Diarrhea []  Abdominal pain []  Heartburn []   Skin: Rashes []  skin lesions []   Psychiatric: Anxiety []  Depression []   Hematologic: Blood clots []  easy bruising []   Endocrine: cold or heat intolerance []   Respiratory: Shortness of breath []  Cough []   Cardiovascular: Chest pain []  Palpitations []   ENT: Difficulty swallowing []   The rest as stated in the history of present illness.    Physical Examination:  General:  Pleasant, straightforward, healthy appearing individual.  Mental Status:  Oriented to person, place, and time. Displays appropriate mood and affect.  Gait:  Gait  is normal.  Gross Motor:  Toe walking, heel walking, tandem gait are all normal.  Strength Testing:  Bilateral lower extremity strength is normal and symmetric. No atrophy or tone abnormalities noted.  Sensation:  Normal, No loss of sensation in the lower extremities or trunk.  Neuro:  Bilateral lower extremity coordination and reflexes are physiologic and symmetric except for a slightly reduced left Achilles reflex.  Spine ROM:  Normal range of motion with no pain reproduction but crepitus with extension.  Straight Leg Raising and/or Foramen Compression Maneuvers:  Straight leg raising in the seated and supine positions are negative for radicular pain.  Palpation:  Inspection and palpatory examination of the Lumbar spine and lower extremities is remarkable slight step-off deformity at the lumbosacral junction. No lymphadenopathy is noted.  Peripheral Vascular:  Pedal pulses are intact. No obvious extremity swelling in the lower extremities.  Appearance of Skin:  Skin inspection of the trunk and lower extremities is  unremarkable for this issue.  Special Testing:  None.    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  I will get the patient started with a focus flexion bias physical therapy program.  Additionally I have asked him to increase his gabapentin 2 900 mg each bedtime.  I will see him back in a few weeks.  If no change I will proceed with more definitive imaging.  His pain complaints at this time or not at the level it warrants surgery especially with the need for a laminectomy and fusion and we have had a good discussion about expectations today.    Thank you for allowing [place] to participate in the care of this patient. Please do not hesitate to call us with any questions.    Ernestine Mcmurray, MD 08/04/2012 8:59 AM

## 2012-08-12 ENCOUNTER — Encounter: Payer: Self-pay | Admitting: Rehabilitative and Restorative Service Providers"

## 2012-08-12 ENCOUNTER — Ambulatory Visit: Payer: Self-pay | Admitting: Rehabilitative and Restorative Service Providers"

## 2012-08-12 DIAGNOSIS — M545 Low back pain, unspecified: Secondary | ICD-10-CM

## 2012-08-12 NOTE — Progress Notes (Signed)
MEDICARE PLAN OF CARE    Please co-sign this Medicare Plan of Care for Physical/Occupational Therapy treatment and return to our office within 3 business days. If you have any questions, please contact us. We appreciate your prompt attention to this request.  Sincerely, [facility]     Patient:  Garrett Rodriguez  [id number]  February 20, 1946    Physical/Occupational Therapist: Sherre Lain, PT    Physician attestation: Physician:  Dr. Mitzie Na  I have reviewed and agree with the documented goals and Plan of Care    ______________________________________________________________________      SPINE ASSESSMENT    Assessment:   Findings consistent with 67 y.o., male with   Encounter Diagnosis   Name Primary?   . Low back pain Yes   .  with pain, ROM limitations, functional limitations    Prognosis:  Fair      Contraindications/Precautions/Limitation:  Per diagnosis  Short Term Goals:(3 week(s)):   Decrease c/o pain by 50% . , Increase strength per exercise progression and Minimal assistance with HEP/ education concepts   Long Term Goals:(6-8 week(s)):   Walk community distances without increased symptoms, Stand as long as needed when completing ADL's without increased symptoms, Patient demonstrates improved functional core strength, Patient will return to full prior level of function  , Independent with symptom management     Treatment Plan:     Patient/family involved in developing goals and treatment plan: Yes  Freq 1-2 times per week for 6-8 week(s)    Treatment plan inclusive of:   Exercise: AROM, AAROM, PROM, Stretching, Progressive Resistive, Aerobic exercise    Manual Techniques:  N/A    Modalities:  Aquatic therapy, Cryotherapy, Functional/Therapeutic activites per flowsheet, Moist heat, Ther Exercise per flowsheet   Functional: Proprioception/Dynamic stability, Sports specific, Functional rehab        Thank you for the referral of this patient to the [facility].    Sherre Lain, PT,  DPT, SCS

## 2012-08-12 NOTE — Progress Notes (Signed)
LUMBAR SPINE EVALUATION   Subjective:  Garrett Rodriguez is a 67 y.o. male who is present today for back, left leg care.  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, Stage manager and Gardening/Yard Work   Sport(s): [employer] 2x/week but not lately  Diagnostic tests: Per report, reviewed, X-ray  Other: NA  Symptom location: posterior-lumbar, and lateral-posterior left leg, thigh, and calf   Relevant symptoms: Aching, Pain    Symptom frequency: Intermittent  Symptom intensity (0 - 10 scale): Now 0 Best 0 Worst  7-8  Night Pain: sometimes      Symptoms worsen with: Walking, prolonged standing, lying flat  Symptoms improve with: sitting   Assistive device:  none  Patient's goals for therapy: Return to sport, Reduce pain, Increase strength, Independent with home program    Objective:  Observation: Patient was able to ambulate into the department with a normal gait pattern.  The patient was able to perform basic bed mobility independent.    Movement Screen: The patient was able to perform heel walking and was able to perform toe walking during the examination.  The patient was able to squat to the floor during the examination.     Palpation:  NT    ROM Loss    Flexion: no loss in motion  Extension:  moderate loss  Left Sidebend: no loss in motion  Right Sidebend:  no loss in motion  Left Rotation:  NT  Right Rotation:  NT  Repeated Movement Testing:  Repeated flexion had no effect on the patients symptoms, Repeated extension increased the patients axial spine pain   Neuromuscular Assessment: Sensation:  Light touch, Intact to gross screen  Myotomes:  LE,  Intact to gross screen     Test:     Lumbar:  Slump, Right LE  negative, Left LE   negative, Straight Leg Raise,  Right LE  negative, Left LE   negative              Cervical Spine:  NT     Sacroilliac Joint:  NT                     Flexibility:   Hamstring:  WNL  Quads:  NT  Psoas:  NT    Comments:  NA  LUMBAR SPINE SCREEN      McGill Stage:  Corrective exercise, Core and whole body stability    Postural correction test:    Sitting Correction: symptoms reduced:  yes    Standing Correction: symptoms reduced:  Indifferent    Directional Preference:  Flexion    Dynamic Stability Test:    Bridge  Glutes:  active  Hamstring:  inactive  Abdominal wall:  inactive    SPINE ASSESSMENT    Assessment:   Findings consistent with 67 y.o., male with   Encounter Diagnosis   Name Primary?   . Low back pain Yes   .  with pain, ROM limitations, functional limitations    Prognosis:  Fair      Contraindications/Precautions/Limitation:  Per diagnosis  Short Term Goals:(3 week(s)):   Decrease c/o pain by 50% . , Increase strength per exercise progression and Minimal assistance with HEP/ education concepts   Long Term Goals:(6-8 week(s)):   Walk community distances without increased symptoms, Stand as long as  needed when completing ADL's without increased symptoms, Patient demonstrates improved functional core strength, Patient will return to full prior level of function  , Independent with symptom management     Treatment Plan:     Patient/family involved in developing goals and treatment plan: Yes  Freq 1-2 times per week for 6-8 week(s)    Treatment plan inclusive of:   Exercise: AROM, AAROM, PROM, Stretching, Progressive Resistive, Aerobic exercise    Manual Techniques:  N/A    Modalities:  Aquatic therapy, Cryotherapy, Functional/Therapeutic activites per flowsheet, Moist heat, Ther Exercise per flowsheet   Functional: Proprioception/Dynamic stability, Sports specific, Functional rehab        Thank you for the referral of this patient to the [facility].    Sherre Lain, PT, DPT, SCS                   TIME + FUNCTIONAL REPORTING SHEET      Treatment Start Time 7:45am   Treatment End Time 8:45am    Total Minutes of treatment 60       Total Non-Treatment time(rest, etc)    Total Service Based Min. of Treatment 60   Total Time-Based minutes of treatment            Service-Based Procedures/ Modalities    Evaluation 60   Re-evaluation    Traction, mechanical    Electric stimulation (unattended)    Whirlpool    Vasopneumatic device    Total service-based billable procedures 60       Time-Based  Procedures / Modalities    Electric stimulation (manual)    Iontophoresis    Contrast baths    Ultrasound    Physical Performance Test    Therapeutic ex    Neuromuscular reed    Gait training, including stairs    Manual Therapy    Therapeutic Activities    Total Time-Based Billable Procedures            MEDICARE FUNCTIONAL REPORTING for PT / OT     GOAL STATUS  Mobility Walking Moving Around /  Mobility GOAL status  G8979  CH  0 % impaired, limited or restricted    CURRENT or DISCHARGE STATUS  CURRENT STATUS  Mobility Walking Moving Around / CURRENT status G8978    CK 40 - < 60% impaired, limited, restricted    Recorded on encounter: YES     POC DATE: 08/12/12

## 2012-08-28 ENCOUNTER — Encounter: Payer: Self-pay | Admitting: Primary Care

## 2012-09-06 ENCOUNTER — Ambulatory Visit: Payer: Self-pay | Admitting: Primary Care

## 2012-10-12 ENCOUNTER — Encounter: Payer: Self-pay | Admitting: Physical Medicine and Rehabilitation

## 2012-10-18 ENCOUNTER — Ambulatory Visit: Payer: Self-pay | Admitting: Physical Medicine and Rehabilitation

## 2012-12-22 ENCOUNTER — Ambulatory Visit: Payer: Self-pay | Admitting: Physical Medicine and Rehabilitation

## 2012-12-22 ENCOUNTER — Encounter: Payer: Self-pay | Admitting: Physical Medicine and Rehabilitation

## 2012-12-22 VITALS — BP 147/75 | HR 59 | Ht 66.0 in | Wt 192.0 lb

## 2012-12-22 DIAGNOSIS — M5440 Lumbago with sciatica, unspecified side: Secondary | ICD-10-CM

## 2012-12-22 DIAGNOSIS — M4317 Spondylolisthesis, lumbosacral region: Secondary | ICD-10-CM

## 2012-12-22 NOTE — Progress Notes (Signed)
Chief complaint: Follow for low back pain    History of present illness: The patient returns in follow up.  He's actually done quite well with his home exercise program.  He's been able to do hiking and skating without any issues.  He has not kept up on his exercises as he is currently healing from an a.c. separation on the right following a fall while playing hockey.  Overall he is happy with the progress he is made.  He is now living full time in [place].    Impression: Grade 2 L5-S1 spondylolisthesis with resolving neurogenic claudication    Plan: As the patient is moving to [place] in the near term and his pain is under relatively good control with exercise and activity modification I do not feel that we need to proceed with any further imaging or an injection at this time.  His symptoms are likely related to the L5 foramen at the spondylolisthesis level.  As he establishes care in [place] I have emphasized how important it is for him to seek any new primary care physician but also establish care with a physical therapist in the area.  His wife works in the same area as the physical therapist and I think that should not be a problem for him.

## 2012-12-24 ENCOUNTER — Encounter: Payer: Self-pay | Admitting: Gastroenterology

## 2013-05-10 DIAGNOSIS — E785 Hyperlipidemia, unspecified: Secondary | ICD-10-CM | POA: Insufficient documentation

## 2013-05-20 ENCOUNTER — Telehealth: Payer: Self-pay | Admitting: Urology

## 2013-05-20 NOTE — Telephone Encounter (Signed)
Received Release of Information request from [facility] [place] requesting records on patient for continuation of care.  The office is located in [place], [place] where patient resides (for full or partial calendar year).    Office letters from our Department and labs were faxed via eRecord routing to [phone].  There was no pathology on file or radiology reports on file.  They requested the last 2 years of radiology, there were no records.  I checked Archive for Pathology reports and there were not any from our Department.

## 2013-05-26 ENCOUNTER — Encounter: Payer: Self-pay | Admitting: Primary Care

## 2013-06-08 ENCOUNTER — Ambulatory Visit: Payer: Self-pay | Admitting: Urology

## 2015-12-26 ENCOUNTER — Telehealth: Payer: Self-pay | Admitting: Primary Care

## 2015-12-26 NOTE — Telephone Encounter (Signed)
Spoke with patient to schedule an appointment. Patient stated he lives in [place] 9 months out of the year and has a primary doctor there.

## 2021-10-12 ENCOUNTER — Ambulatory Visit: Payer: Medicare Other | Admitting: Nurse Practitioner

## 2021-10-12 ENCOUNTER — Encounter: Payer: Self-pay | Admitting: Nurse Practitioner

## 2021-10-12 ENCOUNTER — Other Ambulatory Visit: Payer: Self-pay

## 2021-10-12 VITALS — BP 137/79 | HR 52 | Temp 95.7°F | Resp 16

## 2021-10-12 DIAGNOSIS — H6122 Impacted cerumen, left ear: Secondary | ICD-10-CM

## 2021-10-12 NOTE — UC Provider Note (Signed)
Chief Complaint:   Chief Complaint   Patient presents with   . Otalgia     C/o few days of let sided ear pain.   Denies any hearing changes, fevers or chills.       History provided by: patient    History limited by: n/a    Language interpreter used: no     HPI:  76 y.o. male with history as noted in chart presents to Urgent Care with concern regarding left ear(s).     Patient complains of 2 day(s) of the following symptoms: pain and pressure    Denies: hearing loss    Recent or current URI / allergy symptoms: no  Recent ear infection:  no     VITALS:  BP 137/79   Pulse 52   Temp 35.4 C (95.7 F)   Resp 16   SpO2 100%     ROS: see above / below  Fever: No  Rash: no     PHYSICAL EXAM:  Appearance: well appearing, no acute distress   R Ear: Normal TM and external ear canal   L Ear: unable to view TM due to impacted cerumen   Nose: no significant nasal swelling, congestion, or rhinorrhea   Throat: no redness, no swelling, no exudate   Neck: no cervical LAD, no tenderness, soft  Pulm: no acute respiratory distress       MEDICAL DECISION MAKING:  Assessment: 76 y.o. male with history as noted in chart presents with pain and pressure of left ear(s). Reassuring exam and vitals.     Differential Diagnosis:   Acute Suppurative Otitis Media  Acute Serous Otitis Media  Acute Allergic Otitis Media  Viral URI  Eustachian Tube Dysfunction   Cerumen Impaction   Otitis Externa    Plan and Results:   Orders Placed This Encounter   . Ear irrigation   Left ear irrigation/lavage with warm water was completed on the patient for cerumen impaction by the nurse. The procedure was not supervised by me. Results of procedure were cerumen removed following my assessment. Patient Patient tolerated procedure well. Patient reports he feels much better. NO infection noted in the ear post irrigation       No results found for this visit on 10/12/21.    Diagnosis and Disposition:  1. Impacted cerumen of left ear            Follow up in about 3  days (around 10/15/2021).    Appropriate Handouts provided as needed.    Given the patient's reassuring vital signs and overall well appearance, the patient can be managed safely at home. Patient advised to call PCP or return to urgent care for any worsening or concerning symptoms as reviewed and provided on AVS.

## 2021-10-12 NOTE — Patient Instructions (Signed)
Garrett Rodriguez [facility] you were seen at urgent care for left ear fullness.      Left ear flushed, all ear wax removed.  No infection noted in left ear.    Please purchase and use over-the-counter debrox ear drops as prescribed per packaging to reduce the amount of wax produced in your ears.    Okay to continue Tylenol 650 mg every 4-6 hours as needed for pain. Maximum daily dose = 3000 mg. OR Ibuprofen 400-600 mg every 6-8 hours for pain relief.     Please use an ear plug in affected ear during showering to prevent water from entering your ear canal.    Do not put any object into your ears, including Q-tips.    Please be seen for reevaluation if you develop worsening ear pain, ear drainage or bleeding, ear ringing, loss of hearing, vomiting, fever.     Thank you [name] for choosing [facility] urgent care for your health concerns.    If your condition changes and/or worsens, please follow up with your primary care provider or return to [facility] urgent care for further evaluation.    If short of breath, chest pains or any other concerns please report to the emergency room.    In the event of an Emergency dial 911.

## 2021-10-28 ENCOUNTER — Other Ambulatory Visit: Payer: Self-pay

## 2021-10-28 ENCOUNTER — Ambulatory Visit: Payer: Medicare Other | Admitting: Student in an Organized Health Care Education/Training Program

## 2021-10-28 VITALS — BP 123/70 | HR 57 | Temp 96.8°F | Resp 16

## 2021-10-28 DIAGNOSIS — H6983 Other specified disorders of Eustachian tube, bilateral: Secondary | ICD-10-CM

## 2021-10-28 DIAGNOSIS — H6122 Impacted cerumen, left ear: Secondary | ICD-10-CM

## 2021-10-28 NOTE — UC Provider Note (Signed)
Chief Complaint:   Chief Complaint   Patient presents with   . Otalgia     Pt reports that he has been having L ear problems.  States he was seen here approx 2 weeks ago and had ear flushed.  States symptoms have not really improved since.  Reports ears are popping.       History provided by: patient    History limited by: n/a    Language interpreter used: no     HPI:  76 y.o. male with history as noted in chart presents to Urgent Care with concern regarding bilateral ear(s).     Patient complains of 2 weeks(s) of the following symptoms: fullness and muffled earing, popping sounds     Denies: discharge    Recent or current URI / allergy symptoms: no  Recent ear infection:  no   Had ear flushing done 2 weeks ago     VITALS:  BP 123/70   Pulse 57   Temp 36 C (96.8 F) (Temporal)   Resp 16   SpO2 98%     ROS: see above / below  Fever: No  Rash: no     PHYSICAL EXAM:  Appearance: well appearing, no acute distress   R Ear: Normal TM and external ear canal   L Ear: pale colored soft mass flushed, normal external canal and normal TM  Nose: no significant nasal swelling, congestion, or rhinorrhea   Throat: no redness, no swelling, no exudate   Neck: no cervical LAD, no tenderness, soft  Pulm: no acute respiratory distress       MEDICAL DECISION MAKING:  Assessment: 76 y.o. male with history as noted in chart presents with fullness of left ear(s). Reassuring exam and vitals. I personally flushed ear and was able to remove debris; unclear if this was wax. He did feel better afterwards. Also counseled about underlying eustachian tube dysfunction and recommended starting Flonase to help with this. He is stable for discharge at this time.      Differential Diagnosis:   Acute Suppurative Otitis Media  Acute Serous Otitis Media  Acute Allergic Otitis Media  Viral URI  Eustachian Tube Dysfunction   Cerumen Impaction   Otitis Externa     Plan and Results:   Orders Placed This Encounter   No orders placed during this encounter.        No results found for this visit on 10/28/21.    Diagnosis and Disposition:  1. Impacted cerumen of left ear    2. Dysfunction of both eustachian tubes            No follow-ups on file.    Appropriate Handouts provided as needed.    Given the patient's reassuring vital signs and overall well appearance, the patient can be managed safely at home. Patient advised to call PCP or return to urgent care for any worsening or concerning symptoms as reviewed and provided on AVS.

## 2021-10-28 NOTE — Patient Instructions (Signed)
You had your ears flushed today and a piece came out (unclear if wax of skin)  You can use debrox solution to help rid of wax  Consider flonase if you continue to have fullness and sensation of ear popping; may be a case of eustachian tube dysfunction that can cause fullness

## 2021-11-11 ENCOUNTER — Other Ambulatory Visit: Payer: Self-pay

## 2021-11-11 ENCOUNTER — Ambulatory Visit: Payer: Medicare Other | Admitting: Family

## 2021-11-11 ENCOUNTER — Encounter: Payer: Self-pay | Admitting: Family

## 2021-11-11 VITALS — BP 139/75 | HR 50 | Temp 96.4°F | Resp 18

## 2021-11-11 DIAGNOSIS — H6122 Impacted cerumen, left ear: Secondary | ICD-10-CM

## 2021-11-11 DIAGNOSIS — B369 Superficial mycosis, unspecified: Secondary | ICD-10-CM

## 2021-11-11 DIAGNOSIS — H6242 Otitis externa in other diseases classified elsewhere, left ear: Secondary | ICD-10-CM

## 2021-11-11 MED ORDER — CLOTRIMAZOLE 1 % EX SOLN *I*
Freq: Two times a day (BID) | CUTANEOUS | 0 refills | Status: AC
Start: 2021-11-11 — End: 2021-11-21

## 2021-11-11 NOTE — UC Provider Note (Signed)
Chief Complaint:   Chief Complaint   Patient presents with   . Ear Problem     Pt c/o left ear pain and fullness with some drainage.   Sx's for months and has been here twice for flushing with no relief.  Denies fevers.  Has been taking Flonase OTC with some relief.  Tinnitus in left ear also which is very loud.         History provided by: patient    History limited by: n/a    Language interpreter used: no     HPI:  76 y.o. male with history as noted in chart presents to Urgent Care with concern regarding left ear(s).     Patient complains 2 weeks of the following symptoms: discharge, fullness, pressure and tinnitus    Reports he has been seen here 2 other times in the past month, last 2 weeks ago, and required irrigation of his left ear.  States this temporarily resolves his symptoms and they return.  Reports he has been using Debrox eardrops and Flonase without relief.    Recent or current URI / allergy symptoms: no  Recent ear infection:  no     VITALS:  BP 139/75   Pulse 50   Temp 35.8 C (96.4 F)   Resp 18   SpO2 99%     ROS: see above / below  Fever: No  Rash: no     PHYSICAL EXAM:  Appearance: well appearing, no acute distress   R Ear: Normal TM and external ear canal   L Ear: Unable to assess TM due to thick, white substance with black specks.  Visible external ear canal edematous and erythematous.  Nose: no significant nasal swelling, congestion, or rhinorrhea   Throat: no redness, no swelling, no exudate   Neck: no cervical LAD, no tenderness, soft  Pulm: no acute respiratory distress       MEDICAL DECISION MAKING:  Assessment: 76 y.o. male with history as noted in chart presents with discharge, fullness, pressure and tinnitus of left ear(s). Reassuring exam and vitals.     Differential Diagnosis:   Otomycosis  Cerumen impaction  Otitis externa  Eustachian tube dysfunction  Acute Suppurative Otitis Media  Acute Serous Otitis Media  Acute Allergic Otitis Media  Viral URI      Plan and Results:    Orders Placed This Encounter   . ED/UC REFERRAL TO ENT/OTOLARYNGOLOGY   . Ear irrigation   . clotrimazole (LOTRIMIN) 1 % external solution     Left ear irrigation/lavage with warm water was completed on the patient for cerumen impaction by the nurse. The procedure was not supervised by me. Results of procedure were cerumen removed following my assessment. Patient Patient tolerated procedure well.  Left ear canal noted to be edematous and erythematous with a thick, white substance with black specks.    Clotrimazole solution prescribed twice daily x 10 days.  Referral made to ENT for reevaluation/further management.      No results found for this visit on 11/11/21.    Diagnosis and Disposition:  1. Impacted cerumen of left ear    2. Otomycosis of left ear            Follow up for ENT evaluation.    Appropriate Handouts provided as needed.    Given the patient's reassuring vital signs and overall well appearance, the patient can be managed safely at home. Patient advised to call PCP or return to urgent care for any worsening  or concerning symptoms as reviewed and provided on AVS.

## 2021-11-11 NOTE — Patient Instructions (Signed)
Garrett Rodriguez [facility] you were seen at urgent care for left ear fullness. You were treated for a possible fungal infection in your left ear.     Left ear was flushed, all ear wax removed.     Please use the antifungal eardrops in your left ear twice daily for the next 10 days.    Please follow up with ENT for further evaluation/management. An electronic referral has been sent for you. They should call you within 2-3 business days to set up an appointment that is suitable for you. If they do not call you within 5 days, please call them to set up an appointment at the following number: [phone]    You may use Tylenol 650 mg every 6 hours for pain. If you are able to, you may use Ibuprofen 600 mg every 6 hours for pain. Take with food to avoid upset stomach, nausea or vomiting. For better pain and/or fever control, try alternating the use of Tylenol and Ibuprofen every 3 hours until your symptoms improve. Do not exceed 3,000 mg of Tylenol or 2,400 mg of Ibuprofen in a 24 hour period. Please do not take if you were advised to avoid taking either Tylenol or Ibuprofen.      Please use an ear plug in affected ear during showering to prevent water from entering your ear canal.    Do not put any object into your ears, including Q-tips.    Please be seen for reevaluation if you develop worsening ear pain, ear drainage or bleeding, ear ringing, loss of hearing, vomiting, or fever.       Thank you Garrett Rodriguez for choosing [facility] for your health concerns.    If your condition changes and/or worsens, please follow up with your Primary Care Provider or return to [facility] for further evaluation.    If you feel short of breath, have chest pain, or any other concerns, please report to the Emergency Department.    In the event of an emergency, dial 911.

## 2021-11-22 ENCOUNTER — Other Ambulatory Visit: Payer: Self-pay

## 2021-11-22 ENCOUNTER — Ambulatory Visit: Payer: Medicare Other | Admitting: Otolaryngology

## 2021-11-22 ENCOUNTER — Ambulatory Visit: Payer: Medicare Other | Attending: Audiologist | Admitting: Audiologist

## 2021-11-22 DIAGNOSIS — H6122 Impacted cerumen, left ear: Secondary | ICD-10-CM

## 2021-11-22 DIAGNOSIS — R0989 Other specified symptoms and signs involving the circulatory and respiratory systems: Secondary | ICD-10-CM

## 2021-11-22 DIAGNOSIS — B369 Superficial mycosis, unspecified: Secondary | ICD-10-CM

## 2021-11-22 DIAGNOSIS — H6240 Otitis externa in other diseases classified elsewhere, unspecified ear: Secondary | ICD-10-CM | POA: Insufficient documentation

## 2021-11-22 DIAGNOSIS — H9312 Tinnitus, left ear: Secondary | ICD-10-CM

## 2021-11-22 DIAGNOSIS — J310 Chronic rhinitis: Secondary | ICD-10-CM

## 2021-11-22 DIAGNOSIS — H905 Unspecified sensorineural hearing loss: Secondary | ICD-10-CM

## 2021-11-22 DIAGNOSIS — H9319 Tinnitus, unspecified ear: Secondary | ICD-10-CM

## 2021-11-22 DIAGNOSIS — H903 Sensorineural hearing loss, bilateral: Secondary | ICD-10-CM | POA: Insufficient documentation

## 2021-11-22 NOTE — Progress Notes (Signed)
AUDIOLOGIC EVALUATION     [facility]  [facility] of [facility]   [address] [place], [address]  [place],  [place] [address]  Phone: [phone], Fax: [phone]     Outpatient Visit  Patient: Garrett Rodriguez   MR Number: [id number]   Date of Birth: [birth year]   Date of Visit: 11/22/2021      PURE-TONE TEST RESULTS  Type of Testing: conventional      Test Reliability: good  Transducer: insert  Booth: 1  ANSI S3.21.2004 (R2009)     Air Conduction Testing (dB HL and kHz)     LEFT EAR RIGHT EAR     0.125 0.25 0.50  0.75 1.0 1.5 2.0 3.0 4.0 6.0 8.0  0.125 0.25 0.50 0.75 1.0 1.5 2.0 3.0 4.0 6.0 8.0     15 10   15   10 20  40 20 35      15 10   10   5 20  55 30 25                                                                    Effective Masking Level                                                      Bone Conduction Testing (dB HL and kHz)  Bone conduction was masked when appropriate      0.25 0.50  0.75 1.0 1.5 2.0 3.0 4.0     0.25 0.50 0.75 1.0 1.5 2.0 3.0 4.0      10 10   5   10    35                   55                                                                      Effective Masking Level                                      65        SPEECH AUDIOMETRY     SAT SRT Score dB HL/MCL EML  Test  SAT SRT Score dB HL/MCL EML  Test       100 % 60 30 W-22 CD      100 % 60 30 W-22 CD   EML   EML            EML   EML               Please refer to scanned [facility] [id number] MR for additional information     Notes  Threshold in dB HL  Frequency in kiloHertz (kHz) Legend   dB=decibels  HL=Hearing Level  NR=No Response  VT=Vibro-Tactile EML=Effective Masking Level  SAT=Speech Awareness Threshold  SRT=Speech Reception  Threshold  MCL=Most Comfortable Loudness Level  MLV=Monitored Live Voice  CD=Compact Disk       HISTORY: Garrett Rodriguez was referred by Letta Moynahan, NP for an audiological evaluation to determine whether patient is a candidate for medical, surgical or rehabilitative services. Patient  reports decreased hearing, pain and pressure in his left ear beginning several weeks ago. He has seen Urgent Care on several occasions to have his ear cleaned with only temporary improvement in his symptoms. 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 Today, patient reports that his symptoms have generally improved. He denies otalgia or decreased hearing in his left ear, although he continues to have some fullness/pressure. Patient also notes a long-standing history of tinnitus in both ears.     FINDINGS: Otoscopic evaluation prior to testing revealed clear ear canals, bilaterally; left tympanic membrane was abnormal in appearance. Pure-tone test results indicate normal hearing sensitivity in both ears, except for a mild sensorineural hearing loss at 4000 Hz and 8000 Hz in the left ear and a mild/moderate hearing loss from 4000-6000 Hz in the right ear. Speech recognition ability in quiet was excellent, bilaterally, for open set words presented at a loud conversational level.     TYMPANOMETRY:    Right Ear: Tested Left Ear: Tested   Frequency: 226 Hz  Frequency: 226 Hz   Canal Volume (ml): 1.9 Canal Volume (ml): 1.5   Static Compliance Peak (ml): 0.49 Static Compliance Peak (ml): 0.42   Peak Pressure (daPa): 0 Peak Pressure (daPa): -18   Gradient (daPa): 50 Gradient (daPa): 110      These results are consistent with normal Eustachian tube function, bilaterally.    Today's results were discussed with patient. He is appropriately scheduled to see Letta Moynahan, NP in [facility] immediately following the Audiology visit for medical evaluation of his left ear.    RECOMMENDATIONS: Audiological re-evaluation as per Letta Moynahan, NP.      Knute Neu, Au.D., CCC-A  Audiologist  [facility]

## 2021-11-22 NOTE — Progress Notes (Signed)
Chief Complaint   Patient presents with    Hearing Problem     Subjective   History:  Garrett Rodriguez is a 76 y.o. male who presents for concern for otomycosis.  To review, he was seen in urgent care on 11/11/2021 for persistent drainage, fullness, pressure, tinnitus of the left ear.  He had been seen 3 times at urgent care for irrigation of left ear but symptoms persist.  At the time of last visit there was concern for otomycosis.    Today he reports that overall, he feels that he is having a retraction with his left ear.  He finished 11 days of clotrimazole yesterday.  He has not had any drainage or itching and it feels less plugged.  He reports that he has tinnitus at baseline that has been present for some time.  He also reports runny nose when he is eating.  Lastly, he mentions that he has globus sensation that has been there for many years.  He describes this as a sensation of a dry spot on the left side of his throat.  No significant coughing, throat clearing, GERD symptoms.        History of Present Illness:  Otalgia  Affected ear:  Left  What is your goal for your visit:  End ear discomfort; receive answers to tinnitus, nose & throat questions           Objective   Examination:    Objective:   There were no vitals taken for this visit.  General:   healthy, alert, appears stated age, not in distress, no barriers to communication.   Head and Face:   no craniofacial deformities.   Eyes:  occular motility/gaze intact   Ears:  Right Ear:   External Ear: pre and post auricular regions healthy, without lymph adenopathy, rash, erythema, swelling   EAC: patent without otorrhea, lesions, erythema, edema. EAC skin healthy.   Tympanic membrane: intact, mobile, without evidence of middle ear effusion   Left Ear:   External Ear: pre and post auricular regions healthy, without lymph adenopathy, rash, erythema, swelling   EAC: fungal debris present   Tympanic membrane: intact, mobile, without evidence of middle ear effusion  and fungal debris adherent to tympanic membrane   Tuning Fork Exam:   Not performed   [employer]:   Not performed      Results      11/22/2021    12:00 PM   Conventional Audiogram   R 250 Hz 15   R 500 Hz 10   R 1000 Hz 10   R 2000 Hz 5   R 4000 Hz 55   R 8000 Hz 25   R Speech 100 %   L 250 Hz 15   L 500 Hz 10   L 1000 Hz 15   L 2000 Hz 10   L 4000 Hz 40   L 8000 Hz 35   L Speech 100 %         Procedure note    Procedure: Cerumen Removal, left external auditory canal  Provider: Letta Moynahan, NP  Indications: Unable to visualize left tympanic membrane due to presence of cerumen  Consent: Verbal consent was obtained    Patient was placed in supine position. The operating microscope and ear speculum were used to visualize left ear canal. A moderate amount of cerumen and fungal debris and dry skin was removed from the canal with the use of a # 3 suction, #  5 suction, # 7 suction, and the EAC and TM were painted with gentian violet and dusted with boric acid powder .  The patient tolerated the procedure well.       Assessment     ICD-10-CM ICD-9-CM   1. Otomycosis  B36.9 111.8    H62.40 380.15   2. 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 Globus sensation  R09.89 784.99   5. Sensorineural hearing loss  H90.5 389.10          Plan     Garrett Rodriguez is a 76 y.o. male who presented for above mentioned diagnosis.    Exam reviewed with patient. Pertinent findings were otomycosis of left ear.  Ultimately, a thorough cleaning was achieved of the left ear.  At this point she does not appear to have a significant fungal infection and therefore I treated him with gentian violet and boric acid powder.  I would like to see him back in 3 weeks for a recheck as otomycosis can be difficult to treat.  In regards to his gustatory rhinitis, he does not have a history of glaucoma and could consider Atrovent nasal spray but does not wish to use this currently.  Lastly,  towards the end of our appointment he did mention that he has no abnormal sensation in his throat that has been there for some time.  We will evaluate this at the time of next visit with laryngoscopy exam.    Follow up in about 3 weeks (around 12/13/2021).

## 2021-12-13 ENCOUNTER — Other Ambulatory Visit: Payer: Self-pay

## 2021-12-13 ENCOUNTER — Encounter: Payer: Self-pay | Admitting: Otolaryngology

## 2021-12-13 ENCOUNTER — Ambulatory Visit: Payer: Medicare Other | Admitting: Otolaryngology

## 2021-12-13 VITALS — BP 118/58 | HR 50 | Temp 97.9°F | Ht 66.0 in | Wt 182.9 lb

## 2021-12-13 DIAGNOSIS — R0989 Other specified symptoms and signs involving the circulatory and respiratory systems: Secondary | ICD-10-CM

## 2021-12-13 DIAGNOSIS — H6240 Otitis externa in other diseases classified elsewhere, unspecified ear: Secondary | ICD-10-CM

## 2021-12-13 DIAGNOSIS — B369 Superficial mycosis, unspecified: Secondary | ICD-10-CM

## 2021-12-13 NOTE — Progress Notes (Signed)
Chief Complaint   Patient presents with    Follow-up     Subjective   History:  Garrett Rodriguez is a 76 y.o. male who presents for follow-up for otomycosis.  To review, he was seen in urgent care on 11/11/2021 for persistent drainage, fullness, pressure, tinnitus of the left ear.  He had been seen 3 times at urgent care for irrigation of left ear but symptoms persist.  At the time of last visit there was concern for otomycosis.  I then saw him on 11/22/2021 where he had been on clotrimazole for 11 days but was still found to have otomycosis of the left ear.  Ultimately a thorough cleaning was achieved and because he did not have significant infection and treated him with gentian violet and boric acid powder.  He also endorsed gustatory rhinitis symptoms.  We discussed that we could trial Atrovent nasal spray but he wished to defer.  Lastly, he reported globus sensation for many years that he described as a "dry spot in the back of his throat".  He denied significant coughing, throat clearing, GERD symptoms.  We discussed potential laryngoscopy at today's visit.    Today he reports, his left ear is good.  He has not been having any itching.  He feels his hearing is significantly improved.  In regards to the globus sensation.  This has been going on for many years.  He describes it as a dry spot on the left side of his throat.  He notes that he will occasionally have difficulty swallowing solids but this is rare.  He also has occasional difficulty swallowing large pills.  No significant voice concerns though notices his voice is weak with prolonged use or singing.  He drinks about 60 ounces of water per day.  He does drink 6 cups of Coffee per day.  He does have throat clearing.  He had an EGD performed in the past and it was recommended that he go on omeprazole.  He does not have frequent reflux events.        History of Present Illness:  Cerumen Impaction  Ear pain: No    Ear discharge: No    Hearing loss: No    Tinnitus  (ringing in ear): Yes    Treatments tried:  Irrigation           Objective   Examination:    Objective:   BP 118/58 (BP Location: Left arm, Patient Position: Sitting, Cuff Size: adult)   Pulse 50   Temp 36.6 C (97.9 F) (Temporal)   Ht 1.676 m (5\' 6" )   Wt 83 kg (182 lb 14.4 oz)   BMI 29.52 kg/m   General:   healthy, alert, appears stated age, not in distress, no barriers to communication.   Head and Face:   no craniofacial deformities.   Eyes:  occular motility/gaze intact   Ears:  Right Ear:   External Ear: pre and post auricular regions healthy, without lymph adenopathy, rash, erythema, swelling   EAC: patent without otorrhea, lesions, erythema, edema. EAC skin healthy.   Tympanic membrane: intact, mobile, without evidence of middle ear effusion   Left Ear:   External Ear: pre and post auricular regions healthy, without lymph adenopathy, rash, erythema, swelling   EAC: patent without otorrhea, lesions, erythema, edema. EAC skin healthy.   Tympanic membrane: intact, mobile, without evidence of middle ear effusion   Tuning Fork Exam:   Not performed   [employer]:   Not  performed    Nose:    External Nose: without deformity   Nasal Membranes: healthy, pink and moist   Inferior Turbinates: non-congested, pink and moist   Nasal septum: generally midline   Nasal Cavity: without evidence of mass, polyps, purulence   Oral:   Lips: Without lesion   Teeth: in good restoration   Buccal Mucosa: pink and moist   Tongue: pink, moist, without lesions and of normal size   Uvula/Oral Cavity: uvula midline. Soft and hard palate pink and moist without erythema, fullness, lesion     Pharynx: clear   Larynx:  see procedure note for findings visualized on laryngoscopy   Neck:   Lymph Nodes: None   Thyroid: normal to inspection and palpation   Parotid: normal size, non tender, no mass palpated   Submandibular Gland: normal size, non tender, no mass palpated   Respiratory:  normal respiratory effort   Cardiac:  regular  rate and rhythm, S1, S2 normal, no murmur, click, rub or gallop   Neuro:  CN II-XII were grossly intact       Results      11/22/2021    12:00 PM   Conventional Audiogram   R 250 Hz 15   R 500 Hz 10   R 1000 Hz 10   R 2000 Hz 5   R 4000 Hz 55   R 8000 Hz 25   R Speech 100 %   L 250 Hz 15   L 500 Hz 10   L 1000 Hz 15   L 2000 Hz 10   L 4000 Hz 40   L 8000 Hz 35   L Speech 100 %     PROCEDURE: FLEXIBLE LARYNGOSCOPY  Provider: Letta Moynahan, FNP  ANESTHESIA: topical Tetracaine with Phenylephrine  INDICATIONS:  Verbal consent was obtained.  Globus sensation    After placement of a topical nasal ansesthetic-vasoconstrictive agent, a flexible endocscope was utilized for examination of the entire upper respiratory tract. Examination of the nasal cavity revealed no lesions. The pharyngel walls were well hydrated with adequate saliva. The epiglottis appeared crisp without masses, lesions or edema. The vallecula was clear. The nasopharynx showed no edema nor masses and the pyriform sinuses appeared clear. Both true vocal cords moved well on phonation and were free of nodules, polyps or lesions. The false cords appeared normal. The posterior larynx showed no evidence of abnormality. The patient tolerated the procedure well.         Assessment     ICD-10-CM ICD-9-CM   1. Otomycosis  B36.9 111.8    H62.40 380.15   2. Globus sensation  R09.89 784.99            Plan     Garrett Rodriguez is a 76 y.o. male who presented for above mentioned diagnosis.    Exam reviewed with patient.   I see no evidence of otomycosis on today's exam.  The left ear was dusted with boric acid powder and I recommended that he continue to keep it dry.  In regards to his globus sensation, laryngoscopy was performed today and was normal.  I am most suspicious for mild underlying Laryngopharyngeal Reflux Disorder and have given him information on dietary and lifestyle modifications to make to reduce extra esophageal reflux.  His dysphagia is very minimal  and I would not recommend evaluation pharyngogram at this time however, if it should worsen I have asked him to contact our office for consideration of this.  Follow up if symptoms worsen or fail to improve.

## 2022-07-27 IMAGING — MR MRI RIGHT KNEE WITHOUT CONTRAST
4 of 5 series · 19 of 40 positions shown · IV contrast (gadolinium)
Comparison: 07/07/2022 [facility] radiographs

________________________________________________________________________________________________ 
MRI RIGHT KNEE WITHOUT CONTRAST, 07/27/2022 [DATE]: 
CLINICAL INDICATION: Complex tear of medial meniscus, current injury, right 
knee, initial encounter.
TECHNIQUE: Multiplanar, multiecho position MR images of the knee were performed 
without intravenous gadolinium enhancement. Patient was scanned on a 3T magnet.

[Series 201: survey_right · axial · 10.0mm · 0.94mm/px · z∈[-40,+150]mm · 3 of 15 slices shown]
[im 1/15]
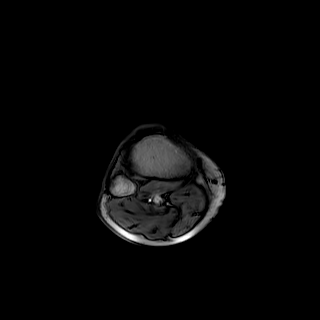
[im 8/15]
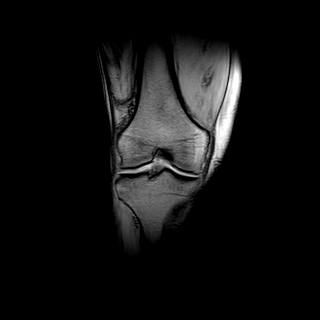
[im 15/15]
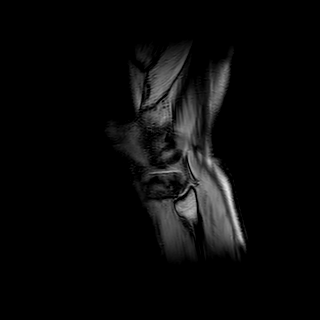

[Series 301: pdw spair ax · axial · 3.0mm · 0.29mm/px · z∈[-76,+43]mm · 5 of 39 slices shown]
[im 1/39]
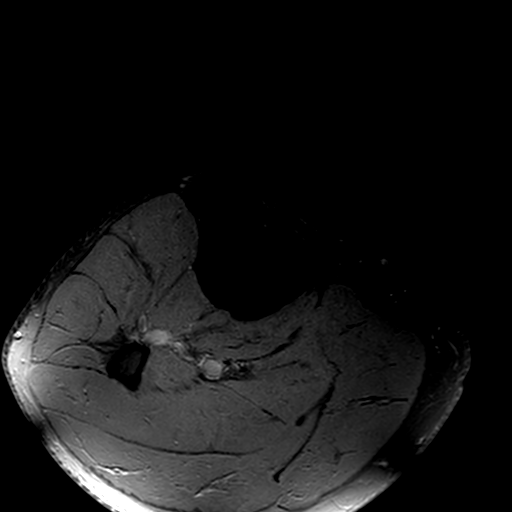
[im 5/39]
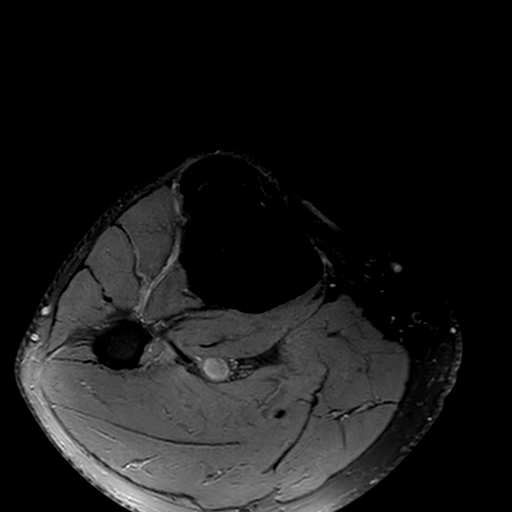
[im 10/39]
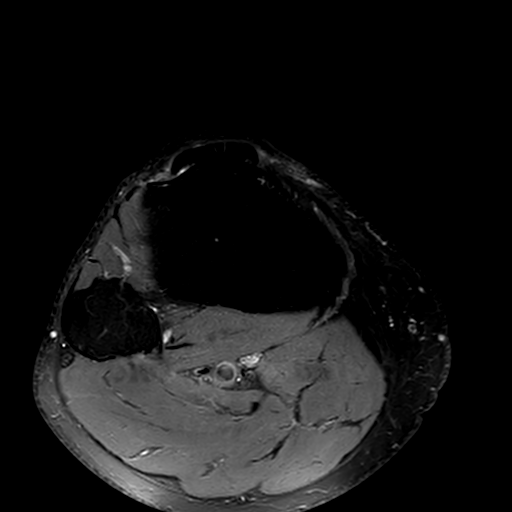
[im 20/39]
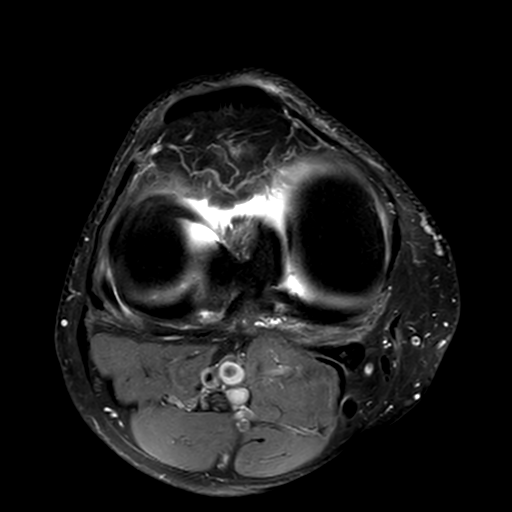
[im 34/39]
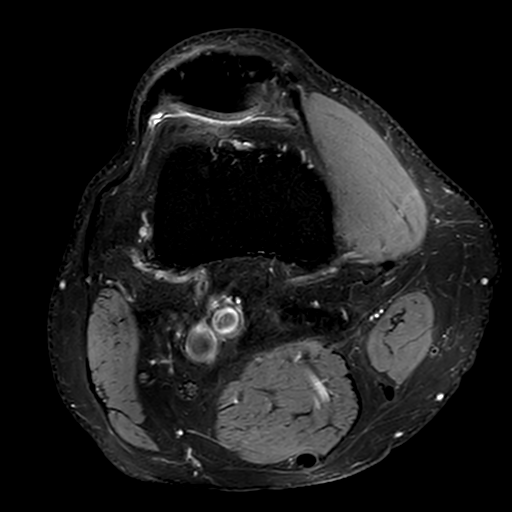

[Series 401: pdw/spair_sag-new · sagittal · 3.0mm · 0.31mm/px · 3 of 32 slices shown]
[im 5/32]
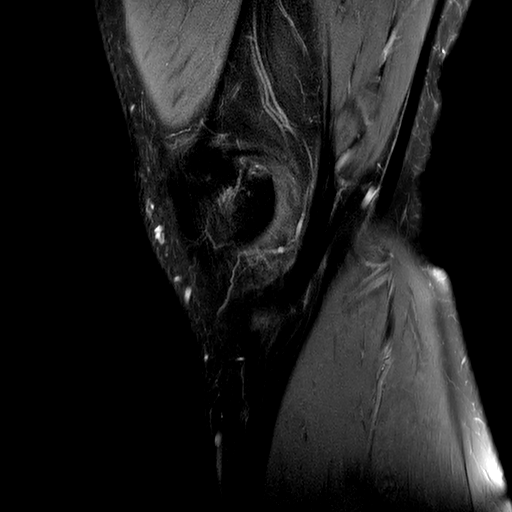
[im 18/32]
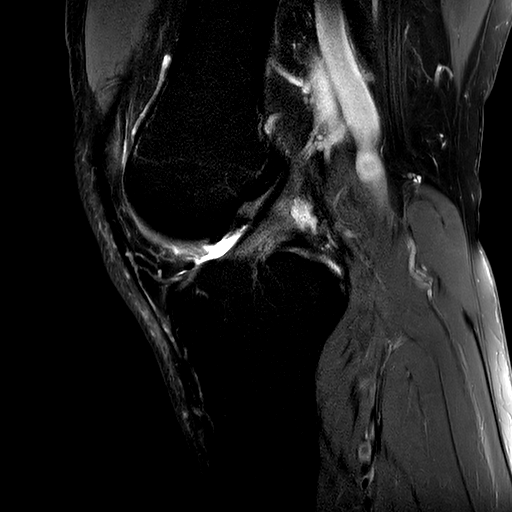
[im 27/32]
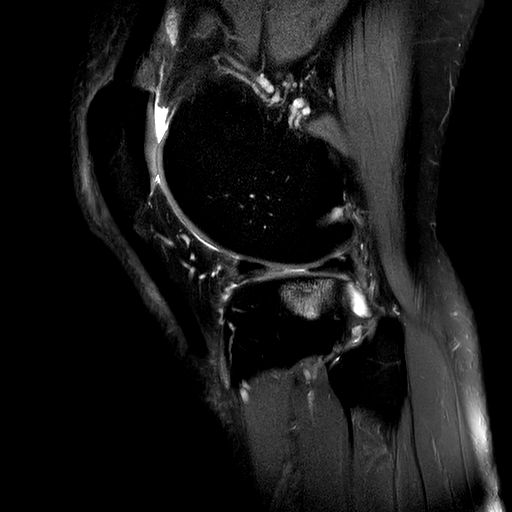

[Series 601: T1 · coronal · 3.0mm · 0.29mm/px · 8 of 40 slices shown]
[im 1/40]
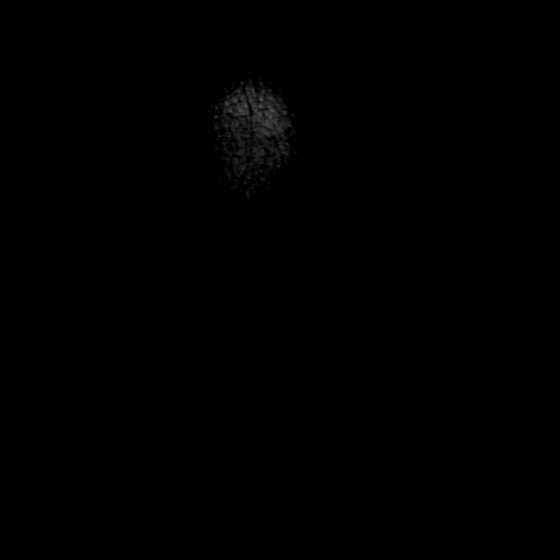
[im 5/40]
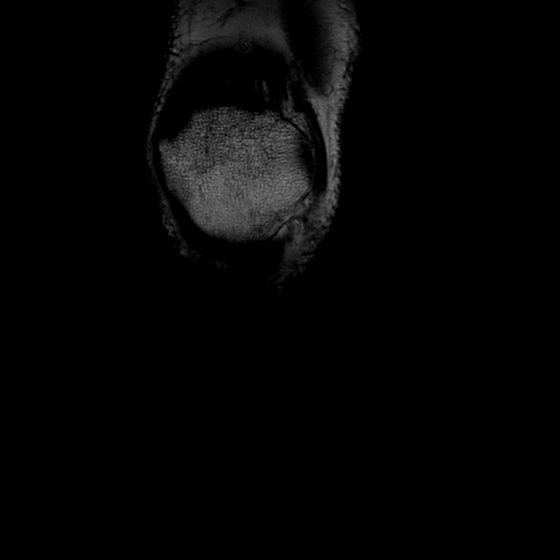
[im 14/40]
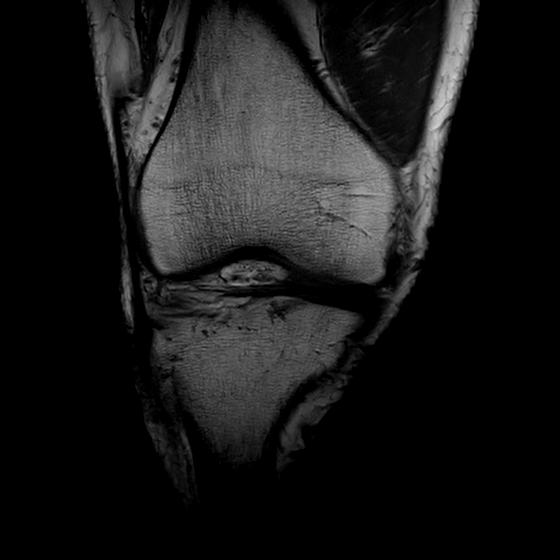
[im 18/40]
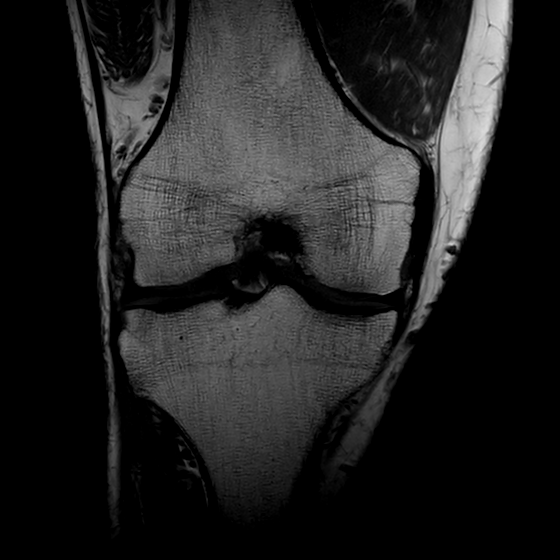
[im 22/40]
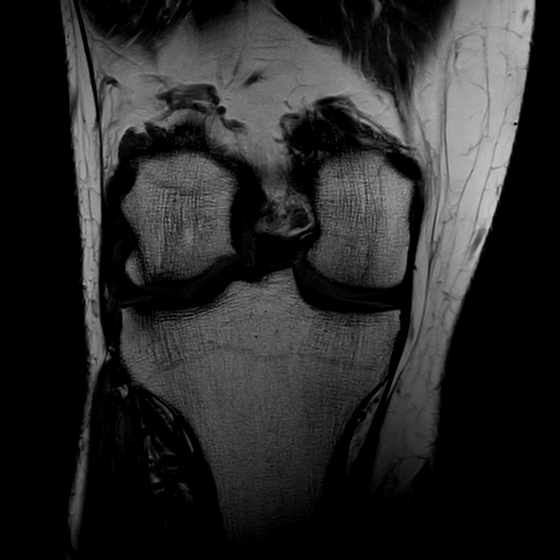
[im 27/40]
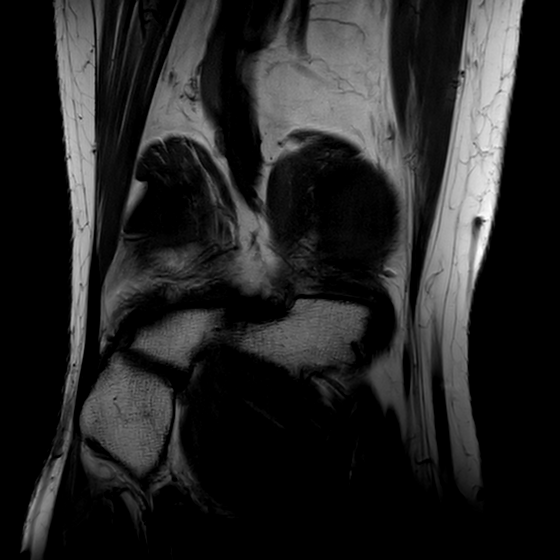
[im 35/40]
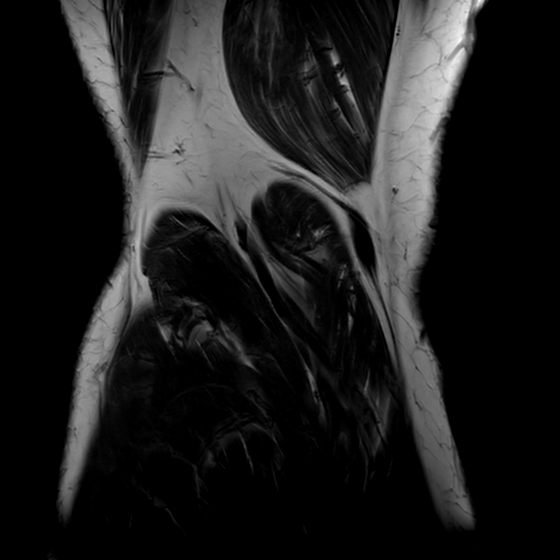
[im 40/40]
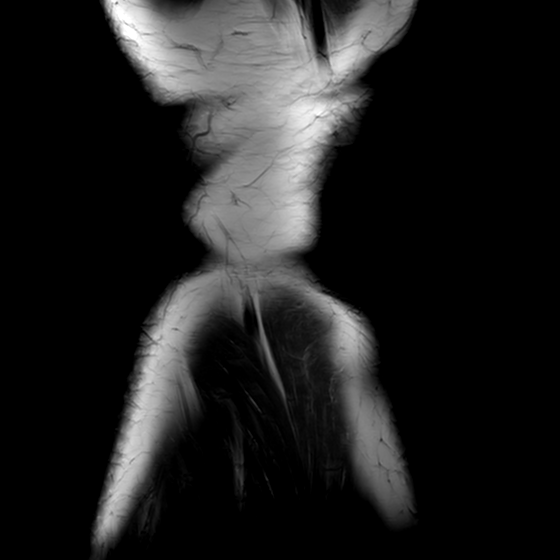

[19 of 40 positions shown; findings below may reference images not displayed]

FINDINGS: MEDIAL COMPARTMENT: Partial-thickness tear of the posterior medial meniscal root 
ligament extending to the posterior horn of the meniscus. No medial meniscal 
extrusion. Articular cartilage is preserved. 
LATERAL COMPARTMENT: Horizontal tear of the posterior horn and body of the 
lateral meniscus. The lateral meniscus is intact without tear or extrusion. Up 
to grade III chondromalacia.  
PATELLOFEMORAL COMPARTMENT: The patella is centrally located. Up to grade IV 
cartilage fissuring and subcortical cystic change of the inferior patella. No 
focal trochlear cartilaginous defects. 
TIBIOFIBULAR COMPARTMENT: Negative. 
LIGAMENTS: The anterior cruciate ligament is intact. The posterior cruciate 
ligament is intact. The medial collateral ligament and lateral collateral 
ligaments are preserved. 
EXTENSOR MECHANISM: The quadriceps and patellar tendon are preserved. The medial 
and lateral retinacula are intact. 
POSTEROMEDIAL CORNER: The semimembranosus and pes anserine tendons are 
preserved. The posterior oblique ligament and posterior medial joint capsule are 
intact. 
POSTEROLATERAL CORNER: The popliteal tendon and popliteofibular ligament are 
intact. The biceps femoris is negative. 
BONES: No fracture. Minimal cystic change of the posterior central tibial 
plateau deep to the posterior medial meniscal root ligament insertion. Lateral 
tibial plateau bone marrow edema measures up to 1.8 cm. Patellar enthesopathy. 
ADDITIONAL FINDINGS: No knee joint effusion. No popliteal cyst. The musculature 
is normal without mass, signal abnormality or atrophy. Minimal anterior knee 
subcutaneous soft tissue swelling. Small varicosities.
IMPRESSION: 1.  Partial-thickness tear of the posterior medial meniscal root ligament 
extending to the posterior horn of the meniscus. 
2.  Horizontal tear of the posterior horn and body of the lateral meniscus.  
3.  Moderate lateral compartment and patellar chondromalacia. 
4.  Patellar enthesopathy. 
5.  Minimal anterior knee subcutaneous soft tissue swelling and small 
varicosities.

## 2022-10-14 ENCOUNTER — Other Ambulatory Visit
Admission: RE | Admit: 2022-10-14 | Discharge: 2022-10-14 | Disposition: A | Discharge status: Home / Self Care | Payer: Medicare Other | Source: Ambulatory Visit

## 2022-10-14 DIAGNOSIS — E039 Hypothyroidism, unspecified: Secondary | ICD-10-CM | POA: Insufficient documentation

## 2022-10-14 DIAGNOSIS — D729 Disorder of white blood cells, unspecified: Secondary | ICD-10-CM | POA: Insufficient documentation

## 2022-10-14 DIAGNOSIS — R7309 Other abnormal glucose: Secondary | ICD-10-CM | POA: Insufficient documentation

## 2022-10-14 LAB — CBC
Hematocrit: 42 % (ref 37–52)
Hemoglobin: 14.1 g/dL (ref 12.0–17.0)
MCV: 96 fL (ref 75–100)
Platelets: 212 10*3/uL (ref 150–450)
RBC: 4.4 MIL/uL (ref 4.0–6.0)
RDW: 12.4 % (ref 0.0–15.0)
WBC: 5.9 10*3/uL (ref 3.5–11.0)

## 2022-10-14 LAB — HEMOGLOBIN A1C: Hemoglobin A1C: 5.5 %

## 2022-10-14 LAB — TSH: TSH: 0.36 u[IU]/mL (ref 0.27–4.20)

## 2023-05-14 IMAGING — MR MRI CERVICAL SPINE WITHOUT CONTRAST
7 of 12 series · 10 of 48 positions shown · IV contrast (gadolinium)
Comparison: None

________________________________________________________________________________________________ 
MRI CERVICAL SPINE WITHOUT CONTRAST, 05/14/2023 [DATE]: 
CLINICAL INDICATION: Cervicalgia , fall one year ago. Neck pain with radiation 
of the right shoulder. No prior surgery.
TECHNIQUE: Multiplanar, multiecho position MR images of the cervical spine were 
performed without intravenous gadolinium enhancement. Patient was scanned on a 
3T magnet.

[Series 101: survey · axial · 10.0mm · 0.94mm/px · 1 of 9 slices shown]
[im 1/9]
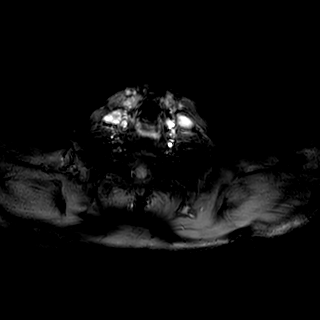

[Series 201: t2w_tse cor · coronal · 5.0mm · 0.52mm/px · 1 of 7 slices shown]
[im 1/7]
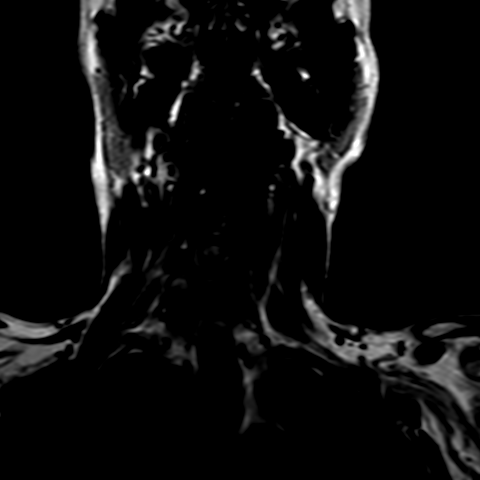

[Series 301: T1 · sagittal · 3.0mm · 0.43mm/px · 1 of 15 slices shown]
[im 1/15]
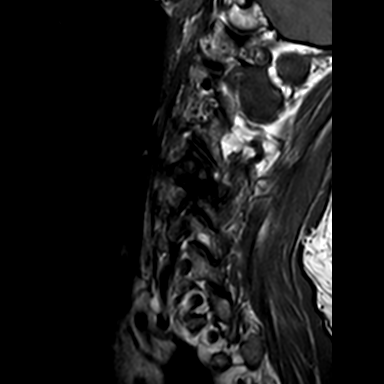

[Series 402: st2w_tse sag fs · sagittal · 3.0mm · 0.37mm/px · 1 of 15 slices shown]
[im 1/15]
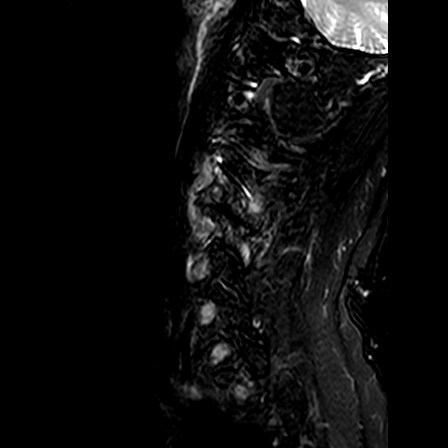

[Series 403: st2w_tse sag · sagittal · 3.0mm · 0.37mm/px · 1 of 15 slices shown]
[im 1/15]
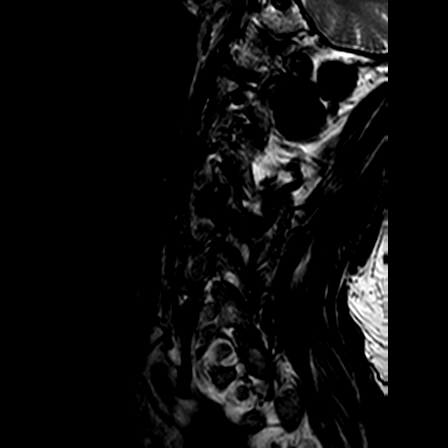

[Series 502: 3dt2 cor/mpr · coronal · 1.0mm · 0.15mm/px · 3 of 78 slices shown]
[im 1/78]
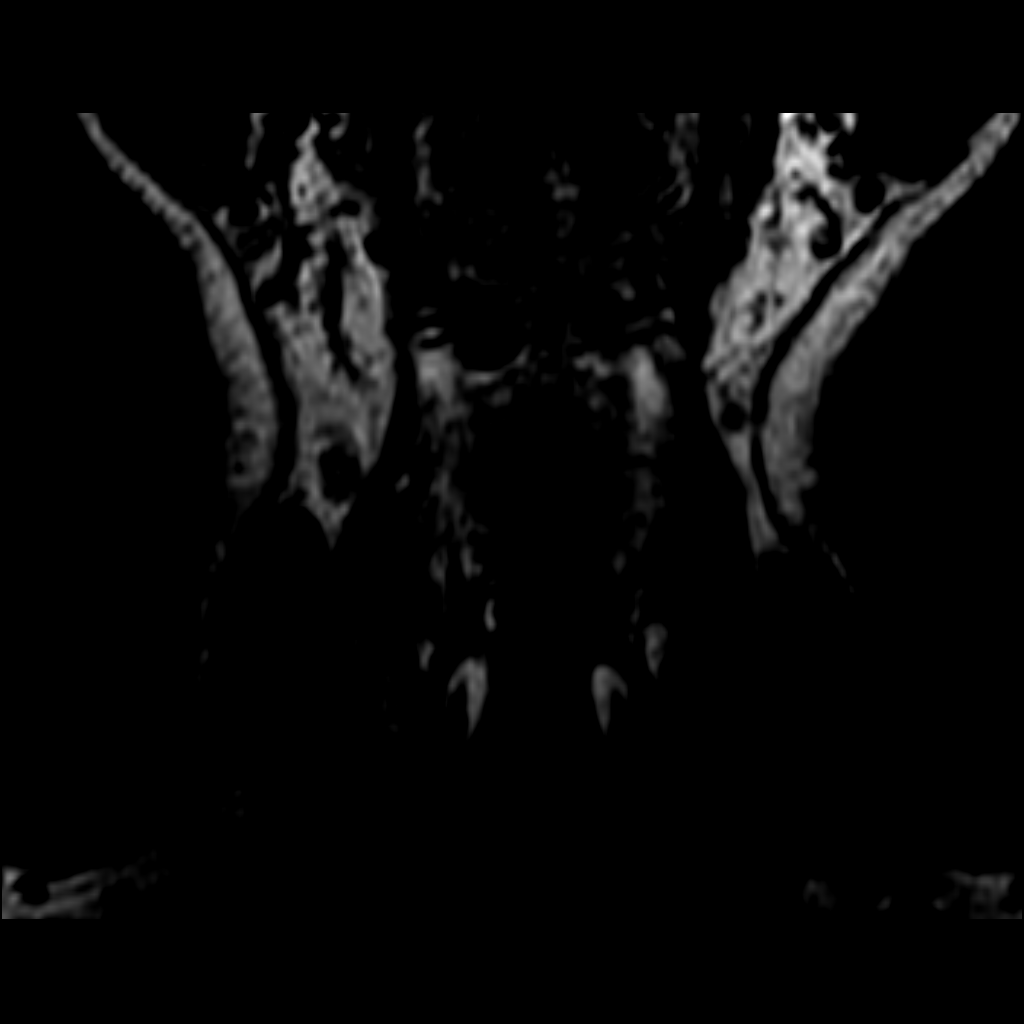
[im 13/78]
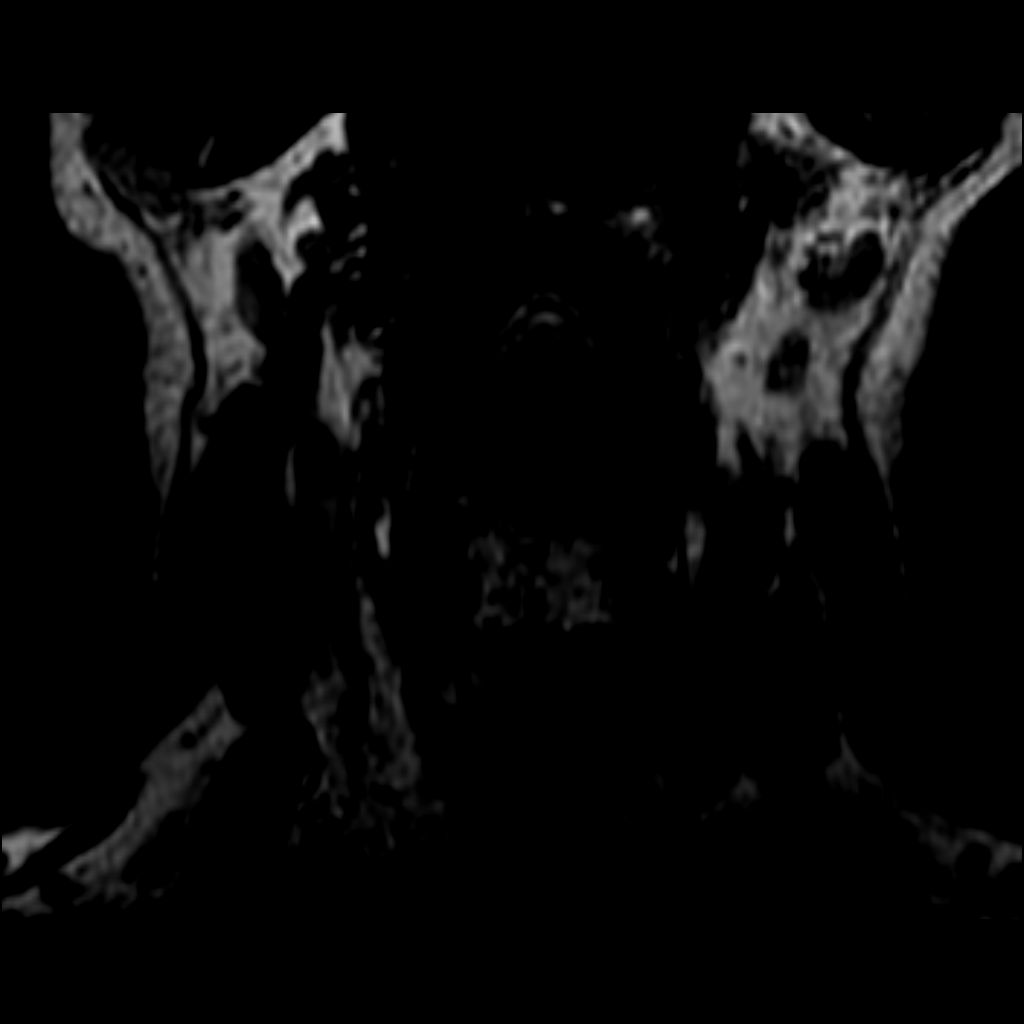
[im 26/78]
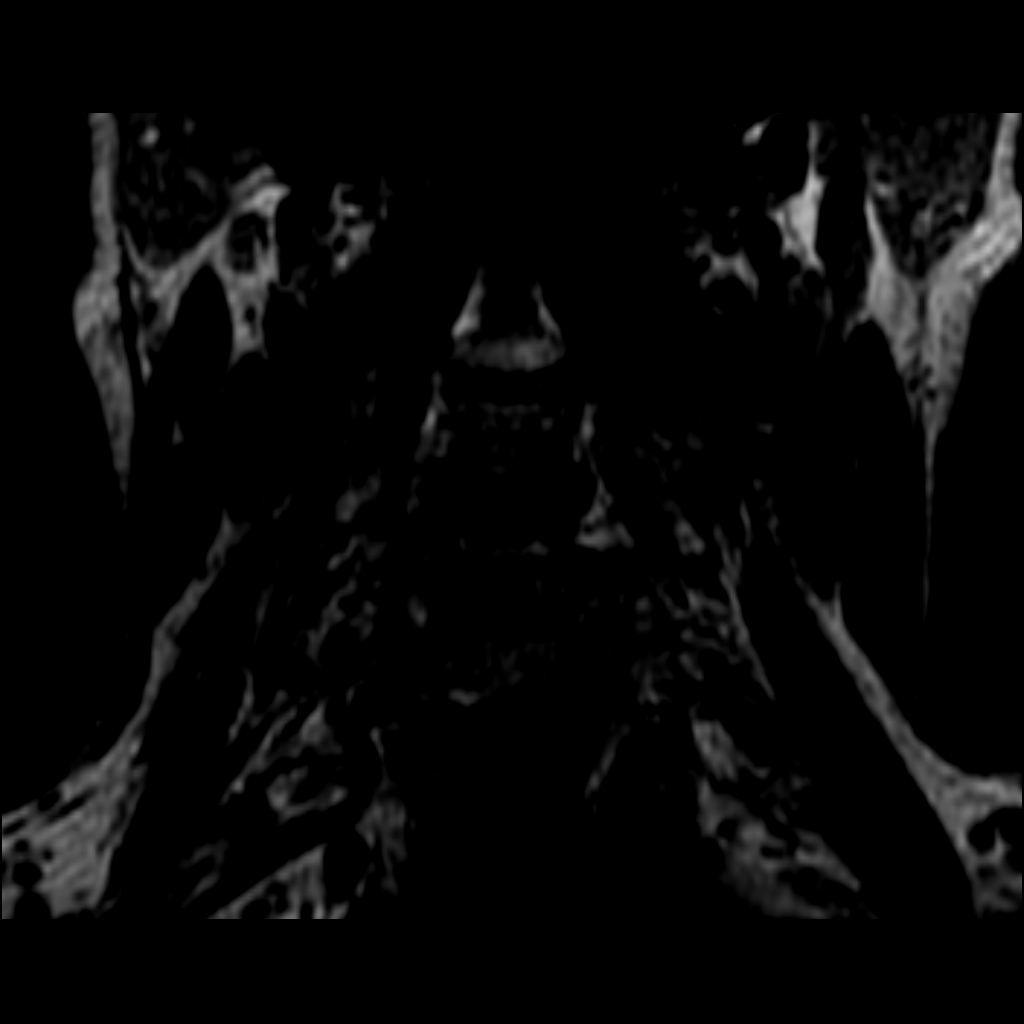

[Series 601: T2 · oblique · 3.0mm · 0.34mm/px · 2 of 19 slices shown]
[im 1/19]
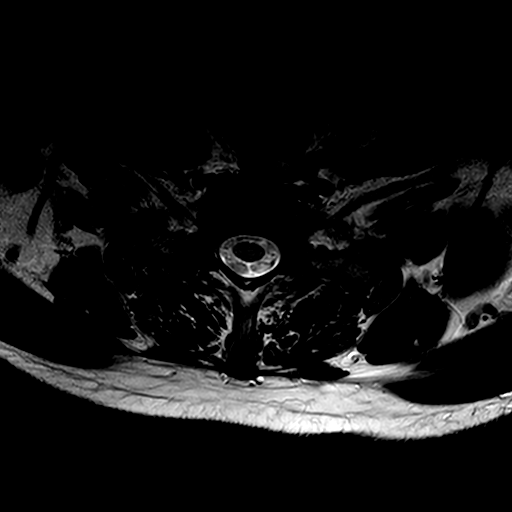
[im 19/19]
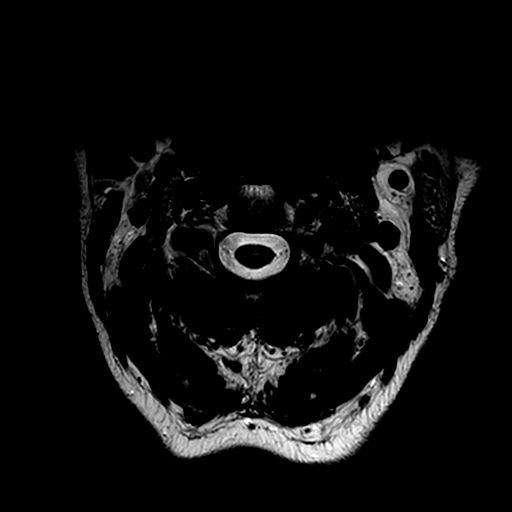

[10 of 48 positions shown; findings below may reference images not displayed]

FINDINGS: -------------------------------------------------------------------------------- 
----------------- 
GENERAL: 
ALIGNMENT: Normal coronal alignment. 2 mm anterolisthesis C4 on C5 with 2 mm 
retrolisthesis C5 on C6. 
VERTEBRAL BODY HEIGHT: Normal.  
MARROW SIGNAL: No focal suspect signal abnormality. 
CORD SIGNAL: Normal.  
ADDITIONAL FINDINGS: There is facet edema and perifacet soft tissue edema 
involving the left C4-C5 facet indicative of active facet arthritis. 
-------------------------------------------------------------------------------- 
---------------- 
SEGMENTAL: 
CRANIOCERVICAL JUNCTION: No significant stenosis. 
C2-C3: Normal disc height. Patent canal. Bilateral facet arthropathy, worse on 
the right. Foramina patent. 
C3-C4: Loss of disc height. Uncinate spurring and facet arthropathy. Minimal 
central disc osteophyte protrusion. Borderline canal stenosis. Mild right and 
mild to moderate left foraminal narrowing. 
C4-C5: Normal disc height. Ligamentum flavum hypertrophy and disc osteophyte 
complex creates moderate canal stenosis and ventral cord flattening. Left-sided 
uncinate spurring. Facet arthropathy, worse on the left. Moderate bilateral 
foraminal narrowing. Correlate for C5 nerve root involvement. 
C5-C6: Loss of disc height. Disc ossify complex with mild to moderate canal 
stenosis and ventral cord flattening. Uncinate spurring and facet arthropathy 
with moderate bilateral foraminal narrowing. Findings could affect exiting C6 
nerve roots. 
C6-C7: Mild loss of disc height. Minimal disc osteophyte complex. Canal patent. 
Uncinate spurring and right-sided facet arthropathy. Mild to moderate bilateral 
foraminal narrowing. 
C7-T1: Normal disc height. No herniation. Normal facets. No spinal canal or 
neural foraminal stenosis. 
-------------------------------------------------------------------------------- 
---------------
IMPRESSION: Evidence of active facet arthritis left C4-C5 facet. 
Cervical spondylosis. Moderate canal stenosis C4-C5. Mild to moderate canal 
stenosis C5-C6. Multilevel foraminal narrowing as above.

## 2023-05-22 IMAGING — CT CT CALCIUM SCORING
1 series · 15 of 20 positions shown, 19 images · non-contrast
Comparison: There are no previous exams available for comparison.

________________________________________________________________________________________________ 
CT CALCIUM SCORING, 05/22/2023 [DATE]:
INDICATION: Encounter For Screening For Cardiovascular Disorders

[Series 2: cascoreseq 3.0 b35s 60% · axial · 0.34mm/px · z∈[-194,-72]mm · 15 of 91 slices shown, 19 images]
[im 5/91  vessel]
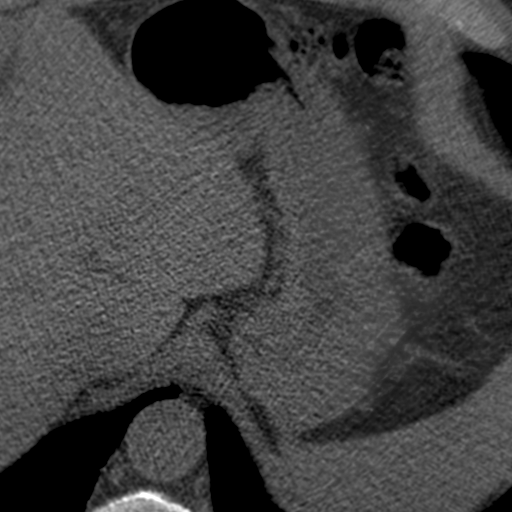
[im 5/91  lung]
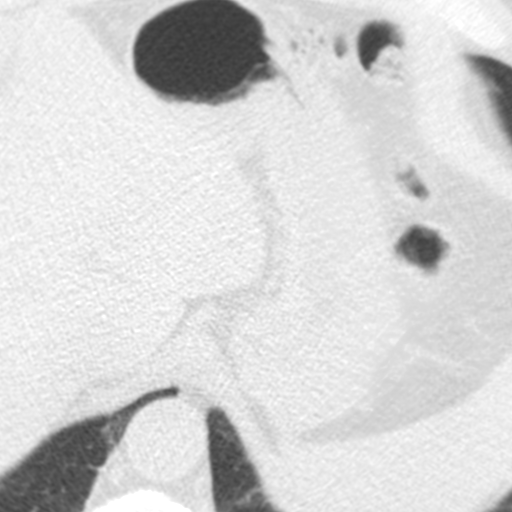
[im 10/91  vessel]
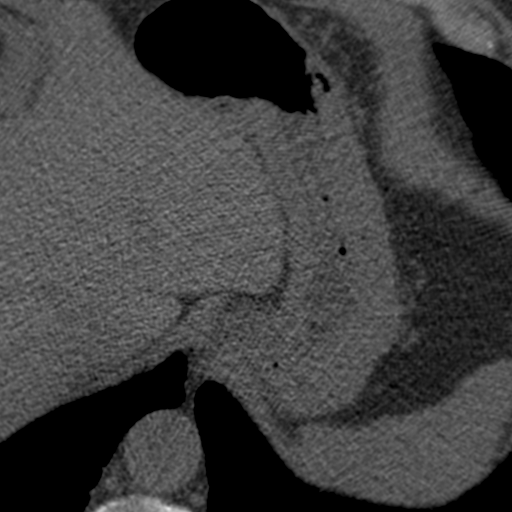
[im 19/91  vessel]
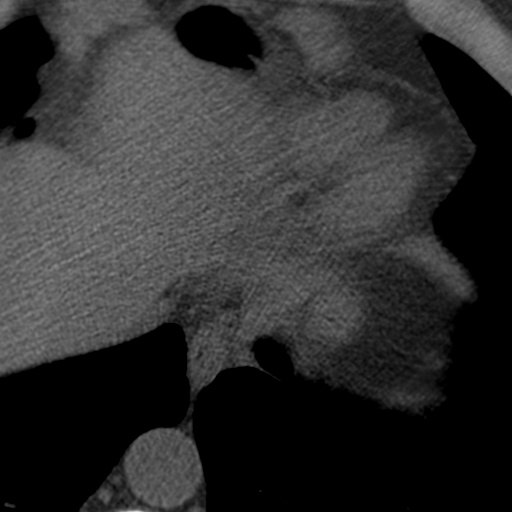
[im 24/91  vessel]
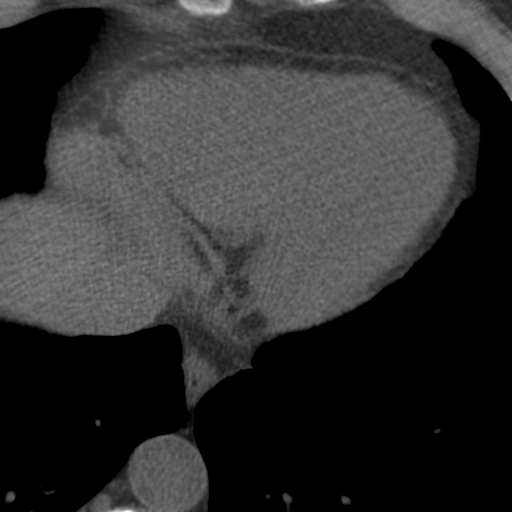
[im 29/91  vessel]
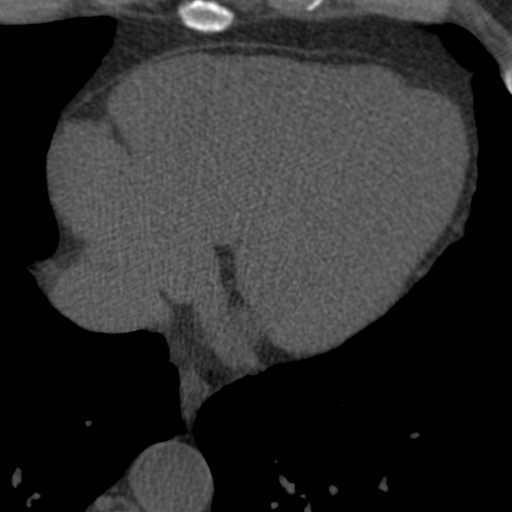
[im 29/91  lung]
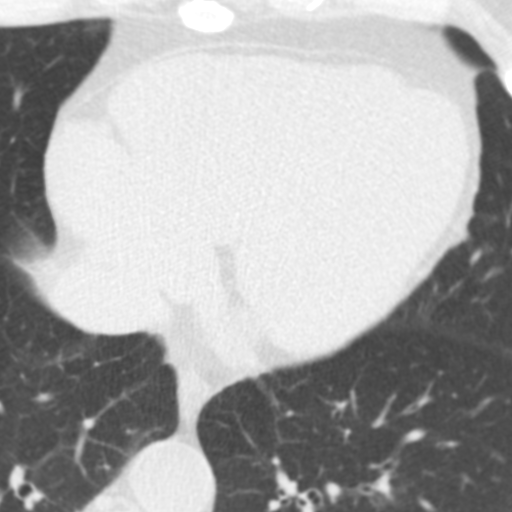
[im 34/91  vessel]
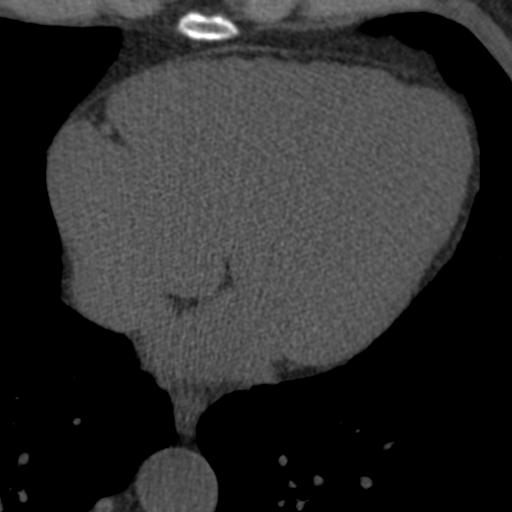
[im 38/91  vessel]
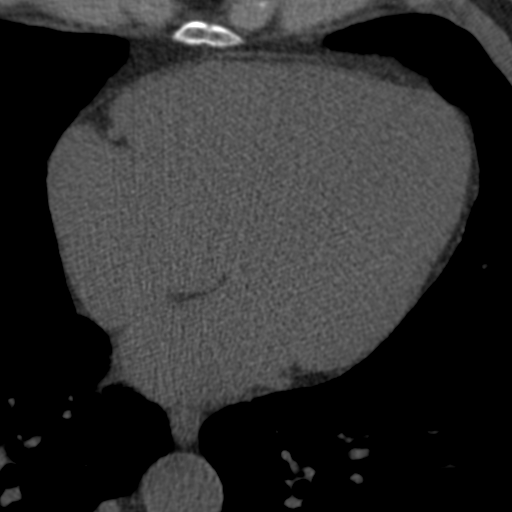
[im 48/91  vessel]
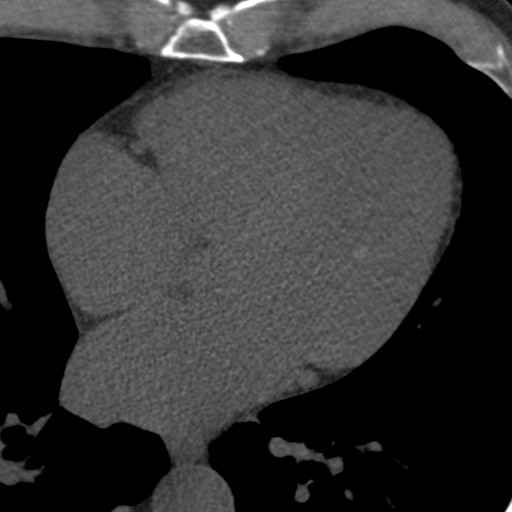
[im 53/91  vessel]
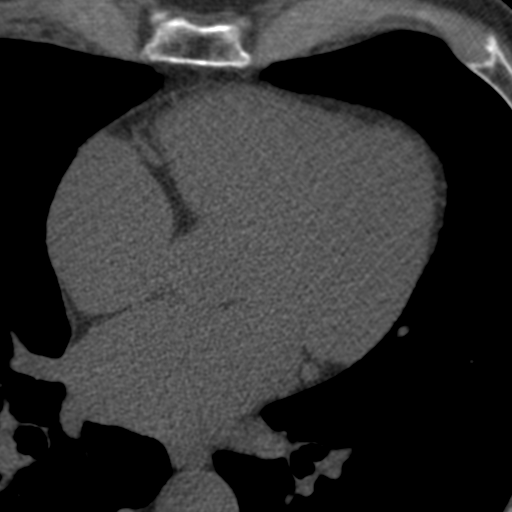
[im 53/91  lung]
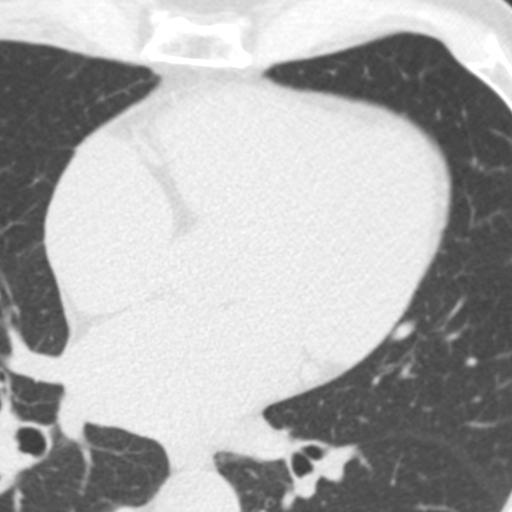
[im 57/91  vessel]
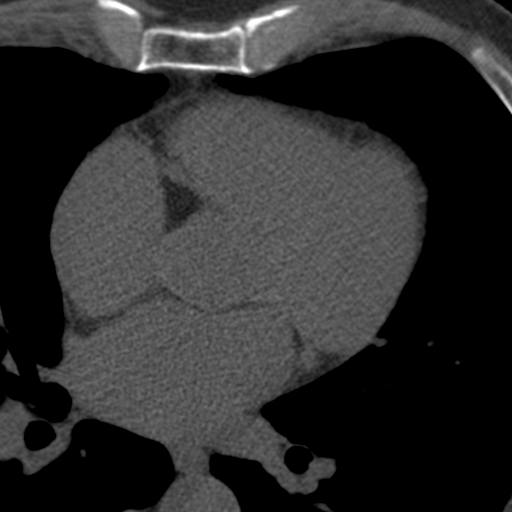
[im 62/91  vessel]
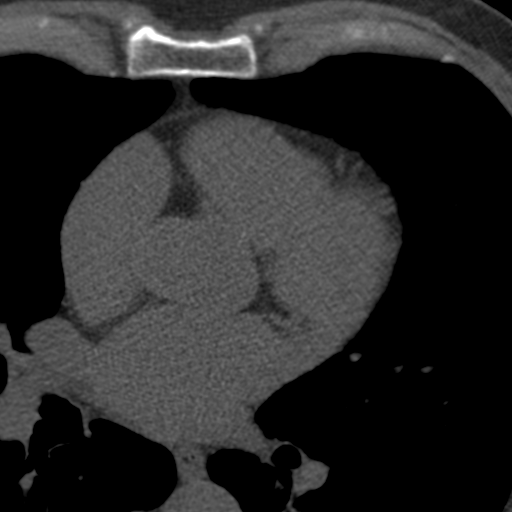
[im 67/91  vessel]
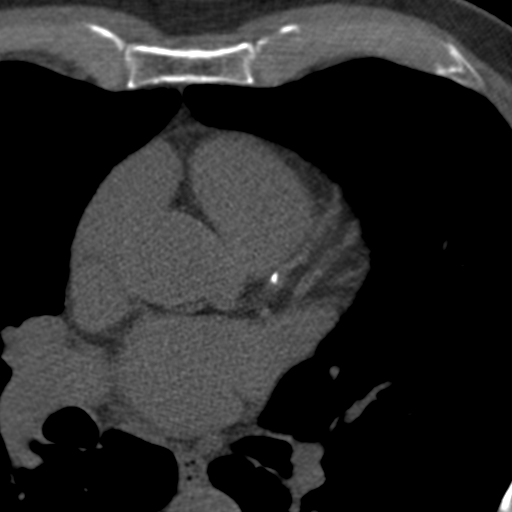
[im 76/91  vessel]
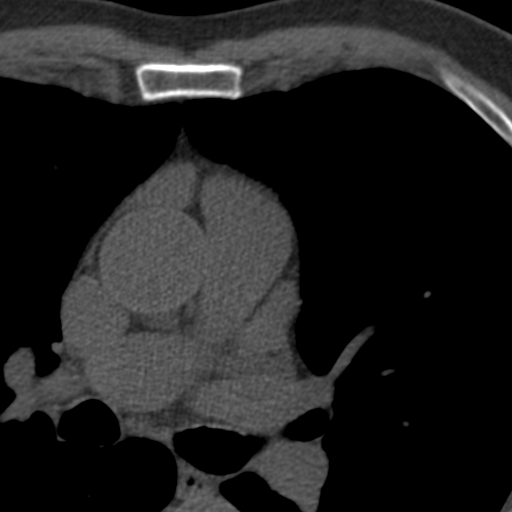
[im 76/91  lung]
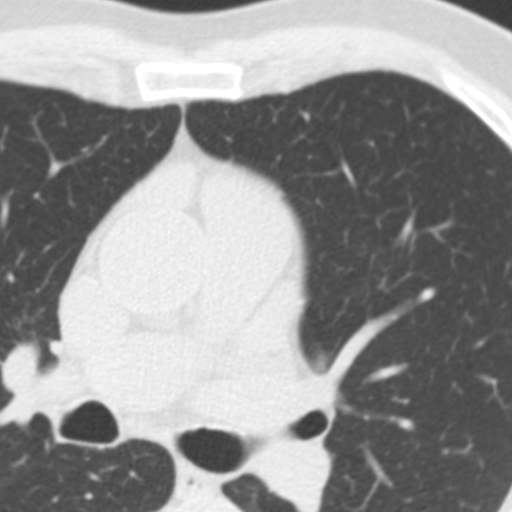
[im 81/91  vessel]
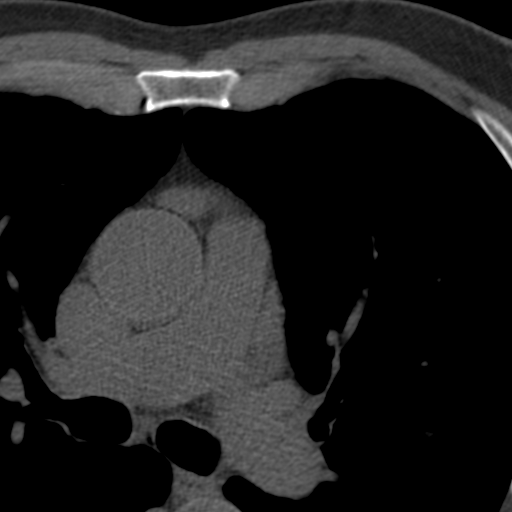
[im 86/91  vessel]
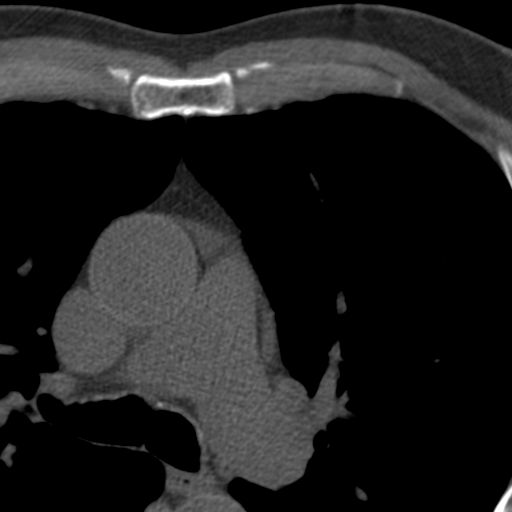

[15 of 20 positions shown; findings below may reference images not displayed]

Count of known CT and Cardiac Nuclear Medicine studies performed in the previous 
12 months = 0
FINDINGS: Mild basilar atelectasis.  
Left Main (LM) Score: 0 
Left Anterior Descending Artery (LAD) Score: 40 
Circumflex (CM) Score: 0 
Right Coronary Artery (RCA) Score: 15 
Posterior Descending Artery (PDA) Score: 16
IMPRESSION: Total Calcium Score: 71 
If calcium score is greater than 100 and less than 5111, would recommend 
follow-up with coronary CTA with Cleerly plaque analysis. 
Calcium scoring sheets to follow. 
RADIATION DOSE REDUCTION: All CT scans are performed using radiation dose 
reduction techniques, when applicable.  Technical factors are evaluated and 
adjusted to ensure appropriate moderation of exposure.  Automated dose 
management technology is applied to adjust the radiation doses to minimize 
exposure while achieving diagnostic quality images. 
Consider further evaluation if multi-vessel or left-main predominant disease is 
present. 
Calcium score                                     Presence of Plaque 
0                                                    No evidence of plaque 
1-10                                               Minimal evidence of plaque 
11-100                                            Mild evidence of plaque 
101-400                                          Moderate evidence of plaque 
Over 400                                        Extensive evidence of plaque

## 7451-09-06 DEATH — deceased
